# Patient Record
Sex: Male | Born: 1935 | Race: White | Hispanic: No | Smoking: Never smoker
Health system: Southern US, Community
[De-identification: ages and names within clinical notes are randomized; demographics above are authoritative.]

## PROBLEM LIST (undated history)

## (undated) DIAGNOSIS — R519 Headache, unspecified: Secondary | ICD-10-CM

## (undated) DIAGNOSIS — J189 Pneumonia, unspecified organism: Secondary | ICD-10-CM

## (undated) DIAGNOSIS — R51 Headache: Secondary | ICD-10-CM

## (undated) DIAGNOSIS — F329 Major depressive disorder, single episode, unspecified: Secondary | ICD-10-CM

## (undated) DIAGNOSIS — G20 Parkinson's disease: Secondary | ICD-10-CM

## (undated) DIAGNOSIS — G8929 Other chronic pain: Secondary | ICD-10-CM

## (undated) DIAGNOSIS — I4891 Unspecified atrial fibrillation: Secondary | ICD-10-CM

## (undated) DIAGNOSIS — I10 Essential (primary) hypertension: Secondary | ICD-10-CM

## (undated) DIAGNOSIS — I2699 Other pulmonary embolism without acute cor pulmonale: Secondary | ICD-10-CM

## (undated) DIAGNOSIS — I639 Cerebral infarction, unspecified: Secondary | ICD-10-CM

## (undated) DIAGNOSIS — E7800 Pure hypercholesterolemia, unspecified: Secondary | ICD-10-CM

## (undated) DIAGNOSIS — I5043 Acute on chronic combined systolic (congestive) and diastolic (congestive) heart failure: Secondary | ICD-10-CM

## (undated) DIAGNOSIS — M545 Low back pain, unspecified: Secondary | ICD-10-CM

## (undated) DIAGNOSIS — G20A1 Parkinson's disease without dyskinesia, without mention of fluctuations: Secondary | ICD-10-CM

## (undated) DIAGNOSIS — F419 Anxiety disorder, unspecified: Secondary | ICD-10-CM

## (undated) DIAGNOSIS — M1990 Unspecified osteoarthritis, unspecified site: Secondary | ICD-10-CM

## (undated) DIAGNOSIS — D693 Immune thrombocytopenic purpura: Secondary | ICD-10-CM

## (undated) DIAGNOSIS — F32A Depression, unspecified: Secondary | ICD-10-CM

## (undated) DIAGNOSIS — N400 Enlarged prostate without lower urinary tract symptoms: Secondary | ICD-10-CM

## (undated) DIAGNOSIS — Z8739 Personal history of other diseases of the musculoskeletal system and connective tissue: Secondary | ICD-10-CM

## (undated) HISTORY — PX: INGUINAL HERNIA REPAIR: SUR1180

## (undated) HISTORY — PX: CHOLECYSTECTOMY: SHX55

## (undated) HISTORY — DX: Benign prostatic hyperplasia without lower urinary tract symptoms: N40.0

## (undated) HISTORY — PX: HERNIA REPAIR: SHX51

## (undated) HISTORY — PX: TONSILLECTOMY: SUR1361

## (undated) HISTORY — PX: UMBILICAL HERNIA REPAIR: SHX196

## (undated) HISTORY — DX: Other pulmonary embolism without acute cor pulmonale: I26.99

---

## 2013-01-29 DIAGNOSIS — N400 Enlarged prostate without lower urinary tract symptoms: Secondary | ICD-10-CM | POA: Diagnosis present

## 2016-08-26 DIAGNOSIS — J189 Pneumonia, unspecified organism: Secondary | ICD-10-CM

## 2016-08-26 HISTORY — DX: Pneumonia, unspecified organism: J18.9

## 2016-11-30 ENCOUNTER — Emergency Department (HOSPITAL_COMMUNITY): Payer: Medicare Other

## 2016-11-30 ENCOUNTER — Observation Stay (HOSPITAL_COMMUNITY): Payer: Medicare Other

## 2016-11-30 ENCOUNTER — Inpatient Hospital Stay (HOSPITAL_COMMUNITY)
Admission: EM | Admit: 2016-11-30 | Discharge: 2016-12-03 | DRG: 917 | Disposition: A | Discharge status: Skilled Nursing Facility | Payer: Medicare Other | Attending: Internal Medicine | Admitting: Internal Medicine

## 2016-11-30 ENCOUNTER — Encounter (HOSPITAL_COMMUNITY): Payer: Self-pay | Admitting: Emergency Medicine

## 2016-11-30 DIAGNOSIS — R4781 Slurred speech: Secondary | ICD-10-CM | POA: Diagnosis present

## 2016-11-30 DIAGNOSIS — I482 Chronic atrial fibrillation, unspecified: Secondary | ICD-10-CM | POA: Diagnosis present

## 2016-11-30 DIAGNOSIS — R1313 Dysphagia, pharyngeal phase: Secondary | ICD-10-CM | POA: Diagnosis present

## 2016-11-30 DIAGNOSIS — D509 Iron deficiency anemia, unspecified: Secondary | ICD-10-CM | POA: Diagnosis present

## 2016-11-30 DIAGNOSIS — Z7901 Long term (current) use of anticoagulants: Secondary | ICD-10-CM

## 2016-11-30 DIAGNOSIS — T40601A Poisoning by unspecified narcotics, accidental (unintentional), initial encounter: Principal | ICD-10-CM | POA: Diagnosis present

## 2016-11-30 DIAGNOSIS — G20A1 Parkinson's disease without dyskinesia, without mention of fluctuations: Secondary | ICD-10-CM | POA: Diagnosis present

## 2016-11-30 DIAGNOSIS — R262 Difficulty in walking, not elsewhere classified: Secondary | ICD-10-CM

## 2016-11-30 DIAGNOSIS — Z8673 Personal history of transient ischemic attack (TIA), and cerebral infarction without residual deficits: Secondary | ICD-10-CM

## 2016-11-30 DIAGNOSIS — G9340 Encephalopathy, unspecified: Secondary | ICD-10-CM | POA: Diagnosis not present

## 2016-11-30 DIAGNOSIS — Z833 Family history of diabetes mellitus: Secondary | ICD-10-CM

## 2016-11-30 DIAGNOSIS — R296 Repeated falls: Secondary | ICD-10-CM | POA: Diagnosis present

## 2016-11-30 DIAGNOSIS — G92 Toxic encephalopathy: Secondary | ICD-10-CM | POA: Diagnosis present

## 2016-11-30 DIAGNOSIS — I10 Essential (primary) hypertension: Secondary | ICD-10-CM | POA: Diagnosis present

## 2016-11-30 DIAGNOSIS — R269 Unspecified abnormalities of gait and mobility: Secondary | ICD-10-CM | POA: Diagnosis present

## 2016-11-30 DIAGNOSIS — Z86711 Personal history of pulmonary embolism: Secondary | ICD-10-CM | POA: Diagnosis present

## 2016-11-30 DIAGNOSIS — S40029A Contusion of unspecified upper arm, initial encounter: Secondary | ICD-10-CM | POA: Diagnosis present

## 2016-11-30 DIAGNOSIS — D649 Anemia, unspecified: Secondary | ICD-10-CM | POA: Diagnosis present

## 2016-11-30 DIAGNOSIS — Z8249 Family history of ischemic heart disease and other diseases of the circulatory system: Secondary | ICD-10-CM

## 2016-11-30 DIAGNOSIS — S40021A Contusion of right upper arm, initial encounter: Secondary | ICD-10-CM

## 2016-11-30 DIAGNOSIS — D693 Immune thrombocytopenic purpura: Secondary | ICD-10-CM | POA: Diagnosis present

## 2016-11-30 DIAGNOSIS — Z9049 Acquired absence of other specified parts of digestive tract: Secondary | ICD-10-CM

## 2016-11-30 DIAGNOSIS — G20 Parkinson's disease: Secondary | ICD-10-CM | POA: Diagnosis present

## 2016-11-30 DIAGNOSIS — Z888 Allergy status to other drugs, medicaments and biological substances status: Secondary | ICD-10-CM

## 2016-11-30 HISTORY — DX: Parkinson's disease without dyskinesia, without mention of fluctuations: G20.A1

## 2016-11-30 HISTORY — DX: Parkinson's disease: G20

## 2016-11-30 HISTORY — DX: Immune thrombocytopenic purpura: D69.3

## 2016-11-30 HISTORY — DX: Unspecified atrial fibrillation: I48.91

## 2016-11-30 HISTORY — DX: Cerebral infarction, unspecified: I63.9

## 2016-11-30 LAB — COMPREHENSIVE METABOLIC PANEL
ALBUMIN: 3.3 g/dL — AB (ref 3.5–5.0)
ALK PHOS: 73 U/L (ref 38–126)
ALT: <5 U/L — ABNORMAL LOW (ref 17–63)
ANION GAP: 10 (ref 5–15)
AST: 21 U/L (ref 15–41)
BILIRUBIN TOTAL: 1.5 mg/dL — AB (ref 0.3–1.2)
BUN: 16 mg/dL (ref 6–20)
CALCIUM: 9 mg/dL (ref 8.9–10.3)
CO2: 30 mmol/L (ref 22–32)
Chloride: 95 mmol/L — ABNORMAL LOW (ref 101–111)
Creatinine, Ser: 0.83 mg/dL (ref 0.61–1.24)
GFR calc Af Amer: >60 mL/min (ref >60)
GLUCOSE: 97 mg/dL (ref 65–99)
Potassium: 4 mmol/L (ref 3.5–5.1)
Sodium: 135 mmol/L (ref 135–145)
TOTAL PROTEIN: 7.3 g/dL (ref 6.5–8.1)

## 2016-11-30 LAB — CBC WITH DIFFERENTIAL/PLATELET
BASOS PCT: 0 %
Basophils Absolute: 0 10*3/uL (ref 0.0–0.1)
Eosinophils Absolute: 0.1 10*3/uL (ref 0.0–0.7)
Eosinophils Relative: 2 %
HEMATOCRIT: 31.8 % — AB (ref 39.0–52.0)
HEMOGLOBIN: 10.2 g/dL — AB (ref 13.0–17.0)
LYMPHS ABS: 1.4 10*3/uL (ref 0.7–4.0)
LYMPHS PCT: 17 %
MCH: 30.1 pg (ref 26.0–34.0)
MCHC: 32.1 g/dL (ref 30.0–36.0)
MCV: 93.8 fL (ref 78.0–100.0)
Monocytes Absolute: 1.3 10*3/uL — ABNORMAL HIGH (ref 0.1–1.0)
Monocytes Relative: 17 %
NEUTROS ABS: 5 10*3/uL (ref 1.7–7.7)
NEUTROS PCT: 64 %
Platelets: 289 10*3/uL (ref 150–400)
RBC: 3.39 MIL/uL — ABNORMAL LOW (ref 4.22–5.81)
RDW: 15.8 % — ABNORMAL HIGH (ref 11.5–15.5)
WBC: 7.9 10*3/uL (ref 4.0–10.5)

## 2016-11-30 LAB — URINALYSIS, ROUTINE W REFLEX MICROSCOPIC
BILIRUBIN URINE: NEGATIVE
Glucose, UA: NEGATIVE mg/dL
HGB URINE DIPSTICK: NEGATIVE
Ketones, ur: NEGATIVE mg/dL
Leukocytes, UA: NEGATIVE
Nitrite: NEGATIVE
PH: 5 (ref 5.0–8.0)
Protein, ur: NEGATIVE mg/dL
SPECIFIC GRAVITY, URINE: 1.018 (ref 1.005–1.030)

## 2016-11-30 LAB — TROPONIN I: Troponin I: <0.03 ng/mL (ref <0.03)

## 2016-11-30 LAB — PROTIME-INR
INR: 1.79
Prothrombin Time: 21 seconds — ABNORMAL HIGH (ref 11.4–15.2)

## 2016-11-30 LAB — AMMONIA: Ammonia: 17 umol/L (ref 9–35)

## 2016-11-30 MED ORDER — ACETAMINOPHEN 650 MG RE SUPP: 650.0000 mg | Freq: Four times a day (QID) | RECTAL | Status: DC | PRN

## 2016-11-30 MED ORDER — ONDANSETRON HCL 4 MG/2ML IJ SOLN: 4.0000 mg | Freq: Four times a day (QID) | INTRAMUSCULAR | Status: DC | PRN

## 2016-11-30 MED ORDER — SODIUM CHLORIDE 0.9 % IV SOLN
INTRAVENOUS | Status: AC
  Administered 2016-11-30: 23:00:00 via INTRAVENOUS

## 2016-11-30 MED ORDER — HEPARIN BOLUS VIA INFUSION
4000.0000 [IU] | Freq: Once | INTRAVENOUS | Status: AC
  Administered 2016-11-30: 4000 [IU] via INTRAVENOUS
  Filled 2016-11-30: qty 4000

## 2016-11-30 MED ORDER — METOPROLOL TARTRATE 5 MG/5ML IV SOLN
5.0000 mg | Freq: Four times a day (QID) | INTRAVENOUS | Status: DC
  Administered 2016-12-01: 5 mg via INTRAVENOUS
  Filled 2016-11-30: qty 5

## 2016-11-30 MED ORDER — LORAZEPAM 2 MG/ML IJ SOLN
0.5000 mg | Freq: Once | INTRAMUSCULAR | Status: AC
  Administered 2016-11-30: 0.5 mg via INTRAVENOUS
  Filled 2016-11-30: qty 1

## 2016-11-30 MED ORDER — LORAZEPAM 2 MG/ML IJ SOLN
1.0000 mg | Freq: Once | INTRAMUSCULAR | Status: AC
  Administered 2016-11-30: 1 mg via INTRAVENOUS
  Filled 2016-11-30: qty 1

## 2016-11-30 MED ORDER — ACETAMINOPHEN 325 MG PO TABS: 650.0000 mg | ORAL_TABLET | Freq: Four times a day (QID) | ORAL | Status: DC | PRN

## 2016-11-30 MED ORDER — ONDANSETRON HCL 4 MG PO TABS: 4.0000 mg | ORAL_TABLET | Freq: Four times a day (QID) | ORAL | Status: DC | PRN

## 2016-11-30 MED ORDER — HEPARIN (PORCINE) IN NACL 100-0.45 UNIT/ML-% IJ SOLN
1700.0000 [IU]/h | INTRAMUSCULAR | Status: DC
  Administered 2016-11-30: 1400 [IU]/h via INTRAVENOUS
  Filled 2016-11-30 (×2): qty 250

## 2016-11-30 NOTE — H&P (Signed)
History and Physical    Garrett RavelRobert Salva UJW:[id number]:[id number] DOB: [birth year] DOA: 11/30/2016  PCP: Paulino DoorWEISER,MARK, MD  Patient coming from: Home.  Chief Complaint: Confusion.  History obtained from ER physician and care everywhere. Patient is confused and no family at the bedside and unable to reach family.  HPI: Garrett Stewart is a 82 y.o. male with history of Parkinson's disease, atrial fibrillation, chronic ITP, history of pulmonary embolism was brought to the ER after patient was getting increasingly confused and agitated. As per the patient's family there was no change in medications recently. Patient was afebrile in the ER.    ED Course: The patient got agitated and was given Ativan. CT of the head was unremarkable. Chest x-ray UA unremarkable. On my exam patient is sedated and does not follow commands. Patient is being admitted for acute encephalopathy.  Review of Systems: As per HPI, rest all negative.   Past Medical History:  Diagnosis Date  . A-fib (HCC)   . Chronic ITP (idiopathic thrombocytopenic purpura) (HCC)   . Parkinson's disease (HCC)   . Stroke [facility](HCC)     Past Surgical History:  Procedure Laterality Date  . CHOLECYSTECTOMY       reports that he has never smoked. He has never used smokeless tobacco. He reports that he does not drink alcohol or use drugs.  Allergies  Allergen Reactions  . Statins Other (See Comments)    Family History  Problem Relation Age of Onset  . Family history unknown: Yes    Prior to Admission medications   Not on File    Physical Exam: Vitals:   11/30/16 1930 11/30/16 1945 11/30/16 2030 11/30/16 2045  BP: 124/72 112/95  167/88  Pulse: 73  70 86  Resp:   (!) 28 24  SpO2: 100%  95% 100%  Weight:      Height:          Constitutional: Moderately built and nourished. Vitals:   11/30/16 1930 11/30/16 1945 11/30/16 2030 11/30/16 2045  BP: 124/72 112/95  167/88  Pulse: 73  70 86  Resp:   (!) 28 24  SpO2: 100%  95% 100%    Weight:      Height:       Eyes: Anicteric no pallor. ENMT: No discharge from the ears eyes nose and mouth. Neck: No neck rigidity no mass felt. Respiratory: No rhonchi or crepitations. Cardiovascular: S1-S2 heard no murmurs appreciated. Abdomen: Soft nontender bowel sounds present. Musculoskeletal: No edema. No joint effusion. Skin: No rash. Skin appears warm. Neurologic: Patient is encephalopathic. Psychiatric: Patient is encephalopathic.   Labs on Admission: I have personally reviewed following labs and imaging studies  CBC:  Recent Labs Lab 11/30/16 1546  WBC 7.9  NEUTROABS 5.0  HGB 10.2*  HCT 31.8*  MCV 93.8  PLT 289   Basic Metabolic Panel:  Recent Labs Lab 11/30/16 1546  NA 135  K 4.0  CL 95*  CO2 30  GLUCOSE 97  BUN 16  CREATININE 0.83  CALCIUM 9.0   GFR: Estimated Creatinine Clearance: 91.7 mL/min (by C-G formula based on SCr of 0.83 mg/dL). Liver Function Tests:  Recent Labs Lab 11/30/16 1546  AST 21  ALT <5*  ALKPHOS 73  BILITOT 1.5*  PROT 7.3  ALBUMIN 3.3*   No results for input(s): LIPASE, AMYLASE in the last 168 hours.  Recent Labs Lab 11/30/16 2101  AMMONIA 17   Coagulation Profile:  Recent Labs Lab 11/30/16 1546  INR 1.79   Cardiac Enzymes:  Recent Labs Lab 11/30/16 1546  TROPONINI <0.03   BNP (last 3 results) No results for input(s): PROBNP in the last 8760 hours. HbA1C: No results for input(s): HGBA1C in the last 72 hours. CBG: No results for input(s): GLUCAP in the last 168 hours. Lipid Profile: No results for input(s): CHOL, HDL, LDLCALC, TRIG, CHOLHDL, LDLDIRECT in the last 72 hours. Thyroid Function Tests: No results for input(s): TSH, T4TOTAL, FREET4, T3FREE, THYROIDAB in the last 72 hours. Anemia Panel: No results for input(s): VITAMINB12, FOLATE, FERRITIN, TIBC, IRON, RETICCTPCT in the last 72 hours. Urine analysis:    Component Value Date/Time   COLORURINE YELLOW 11/30/2016 1820   APPEARANCEUR  CLEAR 11/30/2016 1820   LABSPEC 1.018 11/30/2016 1820   PHURINE 5.0 11/30/2016 1820   GLUCOSEU NEGATIVE 11/30/2016 1820   HGBUR NEGATIVE 11/30/2016 1820   BILIRUBINUR NEGATIVE 11/30/2016 1820   KETONESUR NEGATIVE 11/30/2016 1820   PROTEINUR NEGATIVE 11/30/2016 1820   NITRITE NEGATIVE 11/30/2016 1820   LEUKOCYTESUR NEGATIVE 11/30/2016 1820   Sepsis Labs: @LABRCNTIP (procalcitonin:4,lacticidven:4) )No results found for this or any previous visit (from the past 240 hour(s)).   Radiological Exams on Admission: Dg Chest 2 View  Result Date: 11/30/2016 CLINICAL DATA:  Altered mental status EXAM: CHEST  2 VIEW COMPARISON:  09/12/2016 FINDINGS: The heart size and mediastinal contours are within normal limits. Both lungs are clear. The visualized skeletal structures are unremarkable. IMPRESSION: No active cardiopulmonary disease. Electronically Signed   By: Elige Ko   On: 11/30/2016 15:31   Ct Head Wo Contrast  Result Date: 11/30/2016 CLINICAL DATA:  Altered mental status today. EXAM: CT HEAD WITHOUT CONTRAST TECHNIQUE: Contiguous axial images were obtained from the base of the skull through the vertex without intravenous contrast. COMPARISON:  Head CT scan 09/10/2016. FINDINGS: Brain: There is no evidence of acute intracranial abnormality including hemorrhage, infarct, mass lesion, mass effect, midline shift or abnormal extra-axial fluid collection. No hydrocephalus or pneumocephalus. Vascular: Mild atherosclerosis is noted. Skull: Intact. Sinuses/Orbits: Mild mucosal thickening is seen in the sphenoid sinuses. Otherwise negative. Other: None. IMPRESSION: No acute abnormality. Electronically Signed   By: Drusilla Kanner M.D.   On: 11/30/2016 16:49   Dg Humerus Right  Result Date: 11/30/2016 CLINICAL DATA:  Bruising of the right upper arm secondary to a fall. EXAM: RIGHT HUMERUS - 2+ VIEW COMPARISON:  None. FINDINGS: There is no fracture or dislocation. Slight arthritis of the [facility] joint. Benign bone  island in the proximal humerus. IMPRESSION: No acute abnormalities. Electronically Signed   By: Francene Boyers M.D.   On: 11/30/2016 15:31    EKG: Independently reviewed. A. fib rate controlled.  Assessment/Plan Active Problems:   Acute encephalopathy   Chronic atrial fibrillation (HCC)   Chronic ITP (idiopathic thrombocytopenia) (HCC)   Hypertension   Parkinson's disease (HCC)   Normocytic normochromic anemia   History of pulmonary embolism    1. Acute encephalopathy - cause not clear. Patient is afebrile. I have ordered MRI of the brain to rule out stroke. Will closely monitor. Urine drug screen is pending. Ammonia levels are normal. We'll get ABG if patient remains persistently encephalopathic to check for CO2. Discussing with my colleague patient also has had history of alcohol abuse per the chart so we'll check Thiamine levels. We'll start empiric thiamine iv once thiamine levels are drawn. 2. History of Parkinson's disease - if patient continues to remain encephalopathic, may need to place NG tube and start patient's home dose of Sinemet to avoid neuroleptic malignant syndrome. 3.  History of atrial fibrillation and pulmonary embolism - patient is on Coumadin. Since patient cannot take oral I have requested pharmacy dose heparin and will place patient on scheduled dose of IV metoprolol since patient is on atenolol as per the care everywhere. 4. History of ITP and iron deficiency anemia - follow CBC.  Need to discuss with family in a.m. once able to be reached.   DVT prophylaxis: Heparin infusion. Code Status: Full code.  Family Communication: Unable to reach family.   Disposition Plan: Home.  Consults called: None.  Admission status: Observation.    Eduard Clos MD [facility] Pager [phone].  If 7PM-7AM, please contact night-coverage [email or web address] Password [facility]  11/30/2016, 10:21 PM

## 2016-11-30 NOTE — ED Notes (Signed)
Patient transported to X-ray 

## 2016-11-30 NOTE — ED Provider Notes (Signed)
[facility]-EMERGENCY DEPT Provider Note   CSN: [id number] Arrival date & time: 11/30/16  1432     History   Chief Complaint Chief Complaint  Patient presents with  . Fall    HPI Rufus Beske is a 82 y.o. male.  Pt presents to the ED with frequent falls and slurred speech.  He has a hx of a.fib on coumadin (chadvasc 6) , ITP, anemia, Parkinson's disease, and CVA.  He fell last week and went to his pcp.  Xray of right shoulder showed no fracture, so he went to the [facility] ED on 3/4.  These are the labs/xrays from that visit:    Labs Reviewed  PT EXPANDED INCLUDES INR - Abnormal; Notable for the following:  Result Value  Prothrombin Time 60.4 (*)  INR 5.21 (*)  All other components within normal limits  Narrative:  RECOMMENDED RANGES FOR PROTIME INR: 2.0-3.0 for most medical and surgical thromboembolic states. 2.5-3.5 for artificial heart valves and recurrent embolism.  BASIC METABOLIC PANEL - Abnormal; Notable for the following:  Sodium 133 (*)  Chloride 95 (*)  Glucose 133 (*)  Creatinine 0.75 (*)  All other components within normal limits  B TYPE NATRIURETIC PEPTIDE - Abnormal; Notable for the following:  BNP 164.0 (*)  All other components within normal limits  UA, MICROSCOPIC IF INDICATED BY DIPSTICK - Abnormal; Notable for the following:  Urine Protein Trace (*)  Urine Ketone Trace (*)  Urine Bilirubin Small (*)  All other components within normal limits  CBC WITH AUTO DIFFERENTIAL PANEL - Abnormal; Notable for the following:  RBC 3.78 (*)  Hemoglobin 11.5 (*)  Hematocrit 35.9 (*)  RDW 16.6 (*)  Neutrophil Absolute 7.1 (*)  Monocyte Absolute 1.1 (*)  All other components within normal limits  CBC AND DIFFERENTIAL  Narrative:  The following orders were created for panel order CBC and Differential. Procedure Abnormality Status  --------- ----------- ------  CBC and Differential([id number]) Abnormal Final result   Please view results for these tests on the  individual orders.   Results for orders placed or performed during the hospital encounter of 11/27/16  XR HUMERUS RIGHT  Narrative  X-RAY RIGHT HUMERUS, 11/27/2016 5:50 PM  INDICATION: fall, right shoulder/arm pain \  COMPARISON: None.  Impression  No acute fracture or malalignment.   XR SHOULDER RT 2 OR MORE VIEWS Axillary, External Rotation, Internal Rotation  Narrative  X-RAY RIGHT SHOULDER (2+ VIEWS), 11/27/2016 5:50 PM  INDICATION: swelling \  COMPARISON: 11/17/2016.  Impression  1. No acute fracture or malalignment.  2. Mild degenerative changes of the shoulder with inferiorly directed osteophytes at the acromioclavicular joint. 3. Similar small sclerotic focus in the humeral neck, likely a benign bone island.  XR CHEST PA AND LATERAL  Narrative  XR CHEST PA AND LATERAL, 11/27/2016 5:48 PM  INDICATION:fall \  COMPARISON: 08/17/2016.  FINDINGS:   Cardiovascular/Mediastinum: Stable cardiomediastinal contours. Lungs/pleura: Clear. No pleural effusion or pneumothorax. Upper abdomen: Upper abdominal surgical clips.  Chest wall/osseous structures: No acute osseous findings.   Impression  There is no evidence of acute cardiac or pulmonary abnormality.  CT Head Wo Contrast  Narrative  CT HEAD WO CONTRAST, 11/27/2016 5:56 PM  INDICATION:fall  COMPARISON: 08/14/2016.  TECHNIQUE: Axial CT images of the brain from skull base to vertex, including portions of the face and sinuses, were obtained without contrast. Supplemental 2D reformatted images were generated and reviewed as needed.  All CT scans at [facility] and [facility] are  performed using dose optimization techniques as appropriate to a performed exam, including but not limited to one or more of the following: automated exposure control, adjustment of the mA and/or kV according to patient size, use of iterative reconstruction technique. In addition, [facility] is participating in the ACR  Dose Registry program which will further assist [place] in optimizing patient radiation exposure.   FINDINGS:  . Calvarium/skull base: Mild occipital scalp swelling. No evidence of fracture or destructive lesion. Mastoids and middle ears grossly clear. . Paranasal sinuses: Scattered inflammatory paranasal sinus mucosal thickening with fluid layering in the sphenoid sinuses. . Brain: Mild cerebral volume loss and patchy white matter hypoattenuation, compatible chronic microvascular ischemic changes. No evidence of acute ischemia. No mass effect, mass lesion, acute hemorrhage, or hydrocephalus. Intercranial atherosclerosis.   Impression  1. No CT evidence of acute intracranial abnormality. Although no evidence of acute infarction, mass, or hemorrhage is seen, CT is relatively insensitive for the detection of hypoxia/ischemia within the first 24-48 hours, and an MRI scan may be indicated. 2. Similar cerebral volume loss and chronic microvascular ischemic changes. 3. Inflammatory paranasal sinus disease with fluid layering in the sphenoid sinuses, which can be seen in the setting of acute sinusitis.   EKG: Rate: 75, Rhythm: atrial fibrillation, Intervals: PR-, QRS 104, QTc 439, ST/T Wave abnormalities: no, Conduction Disutrbances: none Narrative Interpretation: A. fib with no ST changes. Normal axis. Old EKG Reviewed: no large change from prior Indication for study: falls   Pt is brought to the ED today due to mental status changes.  The pt's friend said he was having hallucinations today.  He has also been more unsteady on his feet.  This is not his usual.  Of note, they have been holding his coumadin since the 4th because his INR was elevated.      Past Medical History:  Diagnosis Date  . A-fib (HCC)   . Chronic ITP (idiopathic thrombocytopenic purpura) (HCC)   . Parkinson's disease (HCC)   . Stroke [facility])   Past Medical History:  Diagnosis Date  . Anemia  . Atrial fibrillation (HCC)  .  Cholelithiases  . Chronic ITP (idiopathic thrombocytopenia) (HCC)  . CVA (cerebral infarction)  . ETOH abuse  . GERD (gastroesophageal reflux disease)  . GI bleed  . Hypertension  . Hypertension with goal to be determined  . Parkinson's disease (HCC)  . Pulmonary embolism (HCC) 10/03/13  . Stroke (HCC)  . Stroke (HCC) 2014  . Thrombocytopenia (HCC)  . Transfusion history   There are no active problems to display for this patient.   Past Surgical History:  Procedure Laterality Date  . CHOLECYSTECTOMY       Past Surgical History:  Procedure Laterality Date  . ABLATION  . CHOLECYSTECTOMY  . CIRCUMCISION  . FINGER SURGERY  cut finger off with paper shearer  . HERNIA REPAIR  . LAPAROSCOPIC CHOLECYSTECTOMY W/ CHOLANGIOGRAPHY N/A 03/20/2014  Procedure: CHOLECYSTECTOMY LAPAROSCOPIC W/ CHOLANGIOGRAM, repair of umbilical hernia; Surgeon: Corrie Mckusick, MD; Location[facility] MAIN OR; Service: General; Laterality: N/A;       Home Medications    Prior to Admission medications   Not on File    Family History No family history on file.   Family History  Problem Relation Age of Onset  . Adopted: Yes  . Diabetes Daughter  . Hypertension Son  Social History Social History  Substance Use Topics  . Smoking status: Never Smoker  . Smokeless tobacco: Never Used  . Alcohol use No  Social History  Substance Use Topics  . Smoking status: Never Smoker  . Smokeless tobacco: Current User  Types: Chew  Comment: occasional  . Alcohol use Yes  Comment: last drink 4 weeks ago, recent rehab [place]   Allergies   Statins   Review of Systems Review of Systems  Musculoskeletal:       Right arm/shoulder pain  All other systems reviewed and are negative.    Physical Exam Updated Vital Signs BP 150/73   Pulse 95   Resp 18   Ht 6' (1.829 m)   Wt 238 lb (108 kg)   SpO2 97%   BMI 32.28 kg/m   Physical Exam  Constitutional: He is oriented to person, place,  and time. He appears well-developed and well-nourished.  HENT:  Head: Normocephalic and atraumatic.  Right Ear: External ear normal.  Left Ear: External ear normal.  Nose: Nose normal.  Mouth/Throat: Oropharynx is clear and moist.  Eyes: Conjunctivae and EOM are normal. Pupils are equal, round, and reactive to light.  Neck: Normal range of motion. Neck supple.  Cardiovascular: Normal rate, normal heart sounds and intact distal pulses.  An irregularly irregular rhythm present.  Pulmonary/Chest: Effort normal and breath sounds normal.  Abdominal: Soft. Bowel sounds are normal.  Musculoskeletal:  Right upper arm swelling/bruising  Neurological: He is alert and oriented to person, place, and time.  Skin: Skin is warm.  Bruises/abrasions all over body  Psychiatric: He has a normal mood and affect. His behavior is normal. Judgment and thought content normal.  Nursing note and vitals reviewed.    ED Treatments / Results  Labs (all labs ordered are listed, but only abnormal results are displayed) Labs Reviewed  CBC WITH DIFFERENTIAL/PLATELET - Abnormal; Notable for the following:       Result Value   RBC 3.39 (*)    Hemoglobin 10.2 (*)    HCT 31.8 (*)    RDW 15.8 (*)    Monocytes Absolute 1.3 (*)    All other components within normal limits  COMPREHENSIVE METABOLIC PANEL - Abnormal; Notable for the following:    Chloride 95 (*)    Albumin 3.3 (*)    ALT <5 (*)    Total Bilirubin 1.5 (*)    All other components within normal limits  PROTIME-INR - Abnormal; Notable for the following:    Prothrombin Time 21.0 (*)    All other components within normal limits  URINALYSIS, ROUTINE W REFLEX MICROSCOPIC  TROPONIN I    EKG  EKG Interpretation  Date/Time:  Wednesday November 30 2016 16:01:59 EST Ventricular Rate:  81 PR Interval:    QRS Duration: 111 QT Interval:  388 QTC Calculation: 451 R Axis:   79 Text Interpretation:  Atrial fibrillation no old to compare, but hx a.fib.  Confirmed by [facility] MD, Danel Requena [phone]) on 11/30/2016 4:37:16 PM       Radiology Dg Chest 2 View  Result Date: 11/30/2016 CLINICAL DATA:  Altered mental status EXAM: CHEST  2 VIEW COMPARISON:  09/12/2016 FINDINGS: The heart size and mediastinal contours are within normal limits. Both lungs are clear. The visualized skeletal structures are unremarkable. IMPRESSION: No active cardiopulmonary disease. Electronically Signed   By: Elige Ko   On: 11/30/2016 15:31   Ct Head Wo Contrast  Result Date: 11/30/2016 CLINICAL DATA:  Altered mental status today. EXAM: CT HEAD WITHOUT CONTRAST TECHNIQUE: Contiguous axial images were obtained from the base of the skull through the vertex without intravenous contrast. COMPARISON:  Head CT scan 09/10/2016. FINDINGS: Brain: There is no evidence of acute intracranial abnormality including hemorrhage, infarct, mass lesion, mass effect, midline shift or abnormal extra-axial fluid collection. No hydrocephalus or pneumocephalus. Vascular: Mild atherosclerosis is noted. Skull: Intact. Sinuses/Orbits: Mild mucosal thickening is seen in the sphenoid sinuses. Otherwise negative. Other: None. IMPRESSION: No acute abnormality. Electronically Signed   By: Drusilla Kannerhomas  Dalessio M.D.   On: 11/30/2016 16:49   Dg Humerus Right  Result Date: 11/30/2016 CLINICAL DATA:  Bruising of the right upper arm secondary to a fall. EXAM: RIGHT HUMERUS - 2+ VIEW COMPARISON:  None. FINDINGS: There is no fracture or dislocation. Slight arthritis of the [facility] joint. Benign bone island in the proximal humerus. IMPRESSION: No acute abnormalities. Electronically Signed   By: Francene BoyersJames  Maxwell M.D.   On: 11/30/2016 15:31    Procedures Procedures (including critical care time)  Medications Ordered in ED Medications - No data to display   Initial Impression / Assessment and Plan / ED Course  I have reviewed the triage vital signs and the nursing notes.  Pertinent labs & imaging results that were available  during my care of the patient were reviewed by me and considered in my medical decision making (see chart for details).    Since pt has been here, he has developed hallucinations.  He thinks he's in a dirty ware house tied up with string.  He is getting his significant other confused with other wives he's had.  He is no longer oriented.  The pt is also unable to walk.   Pt d/w Dr. Toniann FailKakrakandy ([facility]) for admission.  Final Clinical Impressions(s) / ED Diagnoses   Final diagnoses:  Encephalopathy  Ambulatory dysfunction  Arm contusion, right, initial encounter  Anticoagulated on Coumadin    New Prescriptions New Prescriptions   No medications on file     Jacalyn LefevreJulie Chalice Philbert, MD 11/30/16 2047

## 2016-11-30 NOTE — ED Notes (Signed)
Pt given sandwich and orange juice

## 2016-11-30 NOTE — ED Triage Notes (Signed)
Pt here from a friends house with c/o frequent falls and slurred speech , unknown if slurred speech is new or old , , pt is on blood thinners

## 2016-11-30 NOTE — ED Notes (Signed)
Pt agitated, ordered ativan but pt refusing and yelling at friend and RN

## 2016-11-30 NOTE — ED Notes (Signed)
Patient transported to MRI 

## 2016-11-30 NOTE — ED Notes (Signed)
Patient transported to CT 

## 2016-11-30 NOTE — Progress Notes (Signed)
ANTICOAGULATION CONSULT NOTE - Initial Consult  Pharmacy Consult for heparin  Indication: AFib  Allergies  Allergen Reactions  . Statins Other (See Comments)    Patient Measurements: Height: 6' (182.9 cm) Weight: 238 lb (108 kg) IBW/kg (Calculated) : 77.6 Heparin Dosing Weight: 100 kg  Vital Signs: BP: 167/88 (03/07 2045) Pulse Rate: 86 (03/07 2045)  Labs:  Recent Labs  11/30/16 1546  HGB 10.2*  HCT 31.8*  PLT 289  LABPROT 21.0*  INR 1.79  CREATININE 0.83  TROPONINI <0.03    Estimated Creatinine Clearance: 91.7 mL/min (by C-G formula based on SCr of 0.83 mg/dL).   Medical History: Past Medical History:  Diagnosis Date  . A-fib (HCC)   . Chronic ITP (idiopathic thrombocytopenic purpura) (HCC)   . Parkinson's disease (HCC)   . Stroke [facility](HCC)       Assessment: 82 yo male due to frequent falls and slurred speech, pt is on warfarin PTA for AFib and hx of CVA. Recently admitted at outside hospital due to a fall, though he has a history of frequent falls. Current INR is 1.79. CBC stable and wnl.   Goal of Therapy:  Heparin level 0.3-0.7 units/ml Monitor platelets by anticoagulation protocol: Yes     Plan:  -Heparin 4000 units x1 then 1400 units//hr -Daily HL, CBC -F/u plan for oral anticoagulation   Narayan Scull, Darl HouseholderAlison M 11/30/2016,10:26 PM

## 2016-12-01 DIAGNOSIS — T40601A Poisoning by unspecified narcotics, accidental (unintentional), initial encounter: Secondary | ICD-10-CM | POA: Diagnosis present

## 2016-12-01 DIAGNOSIS — Z86711 Personal history of pulmonary embolism: Secondary | ICD-10-CM

## 2016-12-01 DIAGNOSIS — Z833 Family history of diabetes mellitus: Secondary | ICD-10-CM | POA: Diagnosis not present

## 2016-12-01 DIAGNOSIS — I482 Chronic atrial fibrillation: Secondary | ICD-10-CM | POA: Diagnosis present

## 2016-12-01 DIAGNOSIS — G20 Parkinson's disease: Secondary | ICD-10-CM

## 2016-12-01 DIAGNOSIS — I10 Essential (primary) hypertension: Secondary | ICD-10-CM | POA: Diagnosis present

## 2016-12-01 DIAGNOSIS — Z7901 Long term (current) use of anticoagulants: Secondary | ICD-10-CM | POA: Diagnosis not present

## 2016-12-01 DIAGNOSIS — R4781 Slurred speech: Secondary | ICD-10-CM | POA: Diagnosis present

## 2016-12-01 DIAGNOSIS — I159 Secondary hypertension, unspecified: Secondary | ICD-10-CM | POA: Diagnosis not present

## 2016-12-01 DIAGNOSIS — Z888 Allergy status to other drugs, medicaments and biological substances status: Secondary | ICD-10-CM | POA: Diagnosis not present

## 2016-12-01 DIAGNOSIS — D693 Immune thrombocytopenic purpura: Secondary | ICD-10-CM

## 2016-12-01 DIAGNOSIS — G9340 Encephalopathy, unspecified: Secondary | ICD-10-CM

## 2016-12-01 DIAGNOSIS — D509 Iron deficiency anemia, unspecified: Secondary | ICD-10-CM | POA: Diagnosis present

## 2016-12-01 DIAGNOSIS — R296 Repeated falls: Secondary | ICD-10-CM | POA: Diagnosis present

## 2016-12-01 DIAGNOSIS — S40029A Contusion of unspecified upper arm, initial encounter: Secondary | ICD-10-CM | POA: Diagnosis present

## 2016-12-01 DIAGNOSIS — R269 Unspecified abnormalities of gait and mobility: Secondary | ICD-10-CM | POA: Diagnosis present

## 2016-12-01 DIAGNOSIS — Z8673 Personal history of transient ischemic attack (TIA), and cerebral infarction without residual deficits: Secondary | ICD-10-CM | POA: Diagnosis not present

## 2016-12-01 DIAGNOSIS — Z8249 Family history of ischemic heart disease and other diseases of the circulatory system: Secondary | ICD-10-CM | POA: Diagnosis not present

## 2016-12-01 DIAGNOSIS — Z9049 Acquired absence of other specified parts of digestive tract: Secondary | ICD-10-CM | POA: Diagnosis not present

## 2016-12-01 DIAGNOSIS — G92 Toxic encephalopathy: Secondary | ICD-10-CM | POA: Diagnosis present

## 2016-12-01 DIAGNOSIS — D649 Anemia, unspecified: Secondary | ICD-10-CM | POA: Diagnosis present

## 2016-12-01 DIAGNOSIS — R1313 Dysphagia, pharyngeal phase: Secondary | ICD-10-CM | POA: Diagnosis present

## 2016-12-01 LAB — CBC
HCT: 30.9 % — ABNORMAL LOW (ref 39.0–52.0)
HEMOGLOBIN: 9.5 g/dL — AB (ref 13.0–17.0)
MCH: 28.8 pg (ref 26.0–34.0)
MCHC: 30.7 g/dL (ref 30.0–36.0)
MCV: 93.6 fL (ref 78.0–100.0)
Platelets: 294 10*3/uL (ref 150–400)
RBC: 3.3 MIL/uL — AB (ref 4.22–5.81)
RDW: 15.3 % (ref 11.5–15.5)
WBC: 7.7 10*3/uL (ref 4.0–10.5)

## 2016-12-01 LAB — HEPATIC FUNCTION PANEL
ALBUMIN: 3.1 g/dL — AB (ref 3.5–5.0)
ALT: 11 U/L — AB (ref 17–63)
AST: 19 U/L (ref 15–41)
Alkaline Phosphatase: 71 U/L (ref 38–126)
BILIRUBIN DIRECT: 0.4 mg/dL (ref 0.1–0.5)
Indirect Bilirubin: 1.4 mg/dL — ABNORMAL HIGH (ref 0.3–0.9)
Total Bilirubin: 1.8 mg/dL — ABNORMAL HIGH (ref 0.3–1.2)
Total Protein: 7.2 g/dL (ref 6.5–8.1)

## 2016-12-01 LAB — GLUCOSE, CAPILLARY
GLUCOSE-CAPILLARY: 122 mg/dL — AB (ref 65–99)
Glucose-Capillary: 109 mg/dL — ABNORMAL HIGH (ref 65–99)
Glucose-Capillary: 96 mg/dL (ref 65–99)
Glucose-Capillary: 99 mg/dL (ref 65–99)

## 2016-12-01 LAB — TSH: TSH: 1.807 u[IU]/mL (ref 0.350–4.500)

## 2016-12-01 LAB — BASIC METABOLIC PANEL
ANION GAP: 9 (ref 5–15)
BUN: 14 mg/dL (ref 6–20)
CALCIUM: 8.6 mg/dL — AB (ref 8.9–10.3)
CHLORIDE: 97 mmol/L — AB (ref 101–111)
CO2: 28 mmol/L (ref 22–32)
Creatinine, Ser: 0.76 mg/dL (ref 0.61–1.24)
GFR calc non Af Amer: >60 mL/min (ref >60)
Glucose, Bld: 98 mg/dL (ref 65–99)
Potassium: 3.9 mmol/L (ref 3.5–5.1)
Sodium: 134 mmol/L — ABNORMAL LOW (ref 135–145)

## 2016-12-01 LAB — RAPID URINE DRUG SCREEN, HOSP PERFORMED
AMPHETAMINES: NOT DETECTED
BARBITURATES: NOT DETECTED
BENZODIAZEPINES: NOT DETECTED
Cocaine: NOT DETECTED
Opiates: POSITIVE — AB
TETRAHYDROCANNABINOL: NOT DETECTED

## 2016-12-01 LAB — HEPARIN LEVEL (UNFRACTIONATED)
HEPARIN UNFRACTIONATED: 0.1 [IU]/mL — AB (ref 0.30–0.70)
Heparin Unfractionated: <0.1 IU/mL — ABNORMAL LOW (ref 0.30–0.70)

## 2016-12-01 LAB — CBG MONITORING, ED
Glucose-Capillary: 90 mg/dL (ref 65–99)
Glucose-Capillary: 92 mg/dL (ref 65–99)

## 2016-12-01 LAB — ETHANOL

## 2016-12-01 MED ORDER — ZOLPIDEM TARTRATE 5 MG PO TABS
2.5000 mg | ORAL_TABLET | Freq: Once | ORAL | Status: AC
  Administered 2016-12-02: 2.5 mg via ORAL
  Filled 2016-12-01: qty 1

## 2016-12-01 MED ORDER — WARFARIN - PHARMACIST DOSING INPATIENT: Freq: Every day | Status: DC

## 2016-12-01 MED ORDER — HEPARIN (PORCINE) IN NACL 100-0.45 UNIT/ML-% IJ SOLN
1700.0000 [IU]/h | INTRAMUSCULAR | Status: DC
  Administered 2016-12-02: 1700 [IU]/h via INTRAVENOUS
  Filled 2016-12-01: qty 250

## 2016-12-01 MED ORDER — WARFARIN SODIUM 5 MG PO TABS
5.0000 mg | ORAL_TABLET | Freq: Once | ORAL | Status: AC
  Administered 2016-12-01: 5 mg via ORAL
  Filled 2016-12-01: qty 1

## 2016-12-01 MED ORDER — B COMPLEX-C PO TABS
1.0000 | ORAL_TABLET | Freq: Every day | ORAL | Status: DC
  Administered 2016-12-01 – 2016-12-03 (×3): 1 via ORAL
  Filled 2016-12-01 (×3): qty 1

## 2016-12-01 MED ORDER — FINASTERIDE 5 MG PO TABS
5.0000 mg | ORAL_TABLET | Freq: Every day | ORAL | Status: DC
  Administered 2016-12-01 – 2016-12-03 (×3): 5 mg via ORAL
  Filled 2016-12-01 (×3): qty 1

## 2016-12-01 MED ORDER — DARIFENACIN HYDROBROMIDE ER 7.5 MG PO TB24
7.5000 mg | ORAL_TABLET | Freq: Every day | ORAL | Status: DC
  Administered 2016-12-01 – 2016-12-03 (×3): 7.5 mg via ORAL
  Filled 2016-12-01 (×3): qty 1

## 2016-12-01 MED ORDER — ATENOLOL 25 MG PO TABS
100.0000 mg | ORAL_TABLET | Freq: Every day | ORAL | Status: DC
  Administered 2016-12-01 – 2016-12-03 (×3): 100 mg via ORAL
  Filled 2016-12-01 (×3): qty 4

## 2016-12-01 MED ORDER — CARBIDOPA-LEVODOPA 25-100 MG PO TABS
2.0000 | ORAL_TABLET | Freq: Two times a day (BID) | ORAL | Status: DC
  Administered 2016-12-01 – 2016-12-03 (×5): 2 via ORAL
  Filled 2016-12-01 (×5): qty 2

## 2016-12-01 MED ORDER — TAMSULOSIN HCL 0.4 MG PO CAPS
0.4000 mg | ORAL_CAPSULE | Freq: Every day | ORAL | Status: DC
  Administered 2016-12-01 – 2016-12-03 (×3): 0.4 mg via ORAL
  Filled 2016-12-01 (×3): qty 1

## 2016-12-01 MED ORDER — CARBIDOPA-LEVODOPA 25-100 MG PO TABS
1.0000 | ORAL_TABLET | ORAL | Status: DC
  Administered 2016-12-01 – 2016-12-02 (×3): 1 via ORAL
  Filled 2016-12-01 (×4): qty 1

## 2016-12-01 MED ORDER — DULOXETINE HCL 60 MG PO CPEP
60.0000 mg | ORAL_CAPSULE | Freq: Every day | ORAL | Status: DC
  Administered 2016-12-01 – 2016-12-03 (×3): 60 mg via ORAL
  Filled 2016-12-01 (×3): qty 1

## 2016-12-01 MED ORDER — POLYETHYLENE GLYCOL 3350 17 G PO PACK: 17.0000 g | PACK | Freq: Every day | ORAL | Status: DC | PRN

## 2016-12-01 NOTE — Progress Notes (Signed)
PROGRESS NOTE    Garrett RavelRobert Stewart  ZOX:[id number] DOB: Jan 14, 1935 DOA: 11/30/2016 PCP: Paulino DoorWEISER,MARK, MD   Chief Complaint  Patient presents with  . Fall     Brief Narrative:  HPI on 11/30/2016 by Dr. Midge MiniumArshad Kakrakandy Garrett Stewart is a 82 y.o. male with history of Parkinson's disease, atrial fibrillation, chronic ITP, history of pulmonary embolism was brought to the ER after patient was getting increasingly confused and agitated. As per the patient's family there was no change in medications recently. Patient was afebrile in the ER.   Assessment & Plan   Acute encephalopathy -Unknown etiology -Currently afebrile, no leukocytosis -CT head/MRI brain unremarkable for acute abnormalities -UA and CXR unremarkable for infection -Drug screen +opiates, Alcohol level <5 -Patient was given ativan overnight for MRI and agitation -?overdose of narcotics (home meds show tramadol, narcotics, robaxin) -Continue tele monitoring  -Continue neuro checks  Parkinson's diseaase -Continue sinemet when able   Atrial fibrillation/history of PE -continue heparin for now  -CHADSVASC 6 -continue IV metoprolol  History ITP/ Iron deficiency anemia -Continue to monitor CBC  DVT Prophylaxis  heparin  Code Status: Full  Family Communication: none at bedside  Disposition Plan: currently in observation  Consultants None  Procedures  none  Antibiotics   Anti-infectives    None      Subjective:   Garrett Stewart seen and examined today.  Currently unarousable, responds to painful stimuli.   Objective:   Vitals:   12/01/16 0939 12/01/16 1000 12/01/16 1100 12/01/16 1400  BP: (!) 153/90  92/66 132/68  Pulse: 80  80 86  Resp: 20  20 20   Temp: 97.6 F (36.4 C)   98.1 F (36.7 C)  TempSrc: Oral   Oral  SpO2: 100%  97% 100%  Weight:  108.4 kg (238 lb 14.4 oz)    Height:       No intake or output data in the 24 hours ending 12/01/16 1450 Filed Weights   11/30/16 1554 12/01/16 1000    Weight: 108 kg (238 lb) 108.4 kg (238 lb 14.4 oz)    Exam  General: Well developed, well nourished, NAD  HEENT: NCAT,  mucous membranes moist.   Cardiovascular: S1 S2 auscultated, no rubs, murmurs or gallops. irregular  Respiratory: Clear to auscultation bilaterally with equal chest rise  Abdomen: Soft, nontender, nondistended, + bowel sounds  Extremities: warm dry without cyanosis clubbing or edema. Bruising and swelling of RUE  Neuro: unable to assess, withdraws to painful/tactile stimuli  Psych: unable to assess   Data Reviewed: I have personally reviewed following labs and imaging studies  CBC:  Recent Labs Lab 11/30/16 1546 12/01/16 0358  WBC 7.9 7.7  NEUTROABS 5.0  --   HGB 10.2* 9.5*  HCT 31.8* 30.9*  MCV 93.8 93.6  PLT 289 294   Basic Metabolic Panel:  Recent Labs Lab 11/30/16 1546 12/01/16 0358  NA 135 134*  K 4.0 3.9  CL 95* 97*  CO2 30 28  GLUCOSE 97 98  BUN 16 14  CREATININE 0.83 0.76  CALCIUM 9.0 8.6*   GFR: Estimated Creatinine Clearance: 95.2 mL/min (by C-G formula based on SCr of 0.76 mg/dL). Liver Function Tests:  Recent Labs Lab 11/30/16 1546 12/01/16 0358  AST 21 19  ALT <5* 11*  ALKPHOS 73 71  BILITOT 1.5* 1.8*  PROT 7.3 7.2  ALBUMIN 3.3* 3.1*   No results for input(s): LIPASE, AMYLASE in the last 168 hours.  Recent Labs Lab 11/30/16 2101  AMMONIA 17  Coagulation Profile:  Recent Labs Lab 11/30/16 1546  INR 1.79   Cardiac Enzymes:  Recent Labs Lab 11/30/16 1546  TROPONINI <0.03   BNP (last 3 results) No results for input(s): PROBNP in the last 8760 hours. HbA1C: No results for input(s): HGBA1C in the last 72 hours. CBG:  Recent Labs Lab 12/01/16 0259 12/01/16 0431 12/01/16 1240  GLUCAP 90 92 96   Lipid Profile: No results for input(s): CHOL, HDL, LDLCALC, TRIG, CHOLHDL, LDLDIRECT in the last 72 hours. Thyroid Function Tests:  Recent Labs  12/01/16 0056  TSH 1.807   Anemia Panel: No  results for input(s): VITAMINB12, FOLATE, FERRITIN, TIBC, IRON, RETICCTPCT in the last 72 hours. Urine analysis:    Component Value Date/Time   COLORURINE YELLOW 11/30/2016 1820   APPEARANCEUR CLEAR 11/30/2016 1820   LABSPEC 1.018 11/30/2016 1820   PHURINE 5.0 11/30/2016 1820   GLUCOSEU NEGATIVE 11/30/2016 1820   HGBUR NEGATIVE 11/30/2016 1820   BILIRUBINUR NEGATIVE 11/30/2016 1820   KETONESUR NEGATIVE 11/30/2016 1820   PROTEINUR NEGATIVE 11/30/2016 1820   NITRITE NEGATIVE 11/30/2016 1820   LEUKOCYTESUR NEGATIVE 11/30/2016 1820   Sepsis Labs: @LABRCNTIP (procalcitonin:4,lacticidven:4)  )No results found for this or any previous visit (from the past 240 hour(s)).    Radiology Studies: Dg Chest 2 View  Result Date: 11/30/2016 CLINICAL DATA:  Altered mental status EXAM: CHEST  2 VIEW COMPARISON:  09/12/2016 FINDINGS: The heart size and mediastinal contours are within normal limits. Both lungs are clear. The visualized skeletal structures are unremarkable. IMPRESSION: No active cardiopulmonary disease. Electronically Signed   By: Elige Ko   On: 11/30/2016 15:31   Ct Head Wo Contrast  Result Date: 11/30/2016 CLINICAL DATA:  Altered mental status today. EXAM: CT HEAD WITHOUT CONTRAST TECHNIQUE: Contiguous axial images were obtained from the base of the skull through the vertex without intravenous contrast. COMPARISON:  Head CT scan 09/10/2016. FINDINGS: Brain: There is no evidence of acute intracranial abnormality including hemorrhage, infarct, mass lesion, mass effect, midline shift or abnormal extra-axial fluid collection. No hydrocephalus or pneumocephalus. Vascular: Mild atherosclerosis is noted. Skull: Intact. Sinuses/Orbits: Mild mucosal thickening is seen in the sphenoid sinuses. Otherwise negative. Other: None. IMPRESSION: No acute abnormality. Electronically Signed   By: Drusilla Kanner M.D.   On: 11/30/2016 16:49   Mr Brain Wo Contrast  Result Date: 11/30/2016 CLINICAL DATA:   Acute encephalopathy. Frequent falls and slurred speech. On Coumadin. History of Parkinson's disease, atrial fibrillation and stroke. EXAM: MRI HEAD WITHOUT CONTRAST TECHNIQUE: Multiplanar, multiecho pulse sequences of the brain and surrounding structures were obtained without intravenous contrast. Due to patient motion, sagittal T1, coronal T2 not obtained. COMPARISON:  CT HEAD November 30, 2016 at 1640 hours FINDINGS: Moderately motion degraded examination. BRAIN: No reduced diffusion to suggest acute ischemia. No susceptibility artifact to lobar hematoma though with small hemorrhages would not be appreciated due to motion. Minimal white matter changes most compatible with chronic small vessel ischemic disease. The ventricles and sulci are normal for patient's age. No suspicious parenchymal signal, masses or mass effect. No abnormal extra-axial fluid collections. VASCULAR: Normal major intracranial vascular flow voids present at skull base. SKULL AND UPPER CERVICAL SPINE: No abnormal sellar expansion. No suspicious calvarial bone marrow signal. Craniocervical junction maintained. SINUSES/ORBITS: Small sphenoid sinus air-fluid levels with mild paranasal sinus mucosal thickening. Mastoid air cells are well aerated. The included ocular globes and orbital contents are non-suspicious. OTHER: None. IMPRESSION: Normal noncontrast MRI of the head for age though, limited by moderate motion and  incomplete examination. Electronically Signed   By: Awilda Metro M.D.   On: 11/30/2016 22:54   Dg Humerus Right  Result Date: 11/30/2016 CLINICAL DATA:  Bruising of the right upper arm secondary to a fall. EXAM: RIGHT HUMERUS - 2+ VIEW COMPARISON:  None. FINDINGS: There is no fracture or dislocation. Slight arthritis of the [facility] joint. Benign bone island in the proximal humerus. IMPRESSION: No acute abnormalities. Electronically Signed   By: Francene Boyers M.D.   On: 11/30/2016 15:31     Scheduled Meds: . metoprolol  5 mg  Intravenous Q6H   Continuous Infusions: . sodium chloride 125 mL/hr at 11/30/16 2301  . heparin 1,700 Units/hr (12/01/16 1342)     LOS: 0 days   Time Spent in minutes   30 minutes  Jaslyne Beeck D.O. on 12/01/2016 at 2:50 PM  Between 7am to 7pm - Pager - [phone]  After 7pm go to [email or web address] - password [email or web address]  And look for the night coverage person covering for me after hours  [facility] Office  [phone]

## 2016-12-01 NOTE — Progress Notes (Signed)
Pt asked RN why he was here. RN stated: "falls and AMS". Pt stated: "Must have been all the pills I took."  RN inquired further and patient stated he took: 2 robaxin, 4 norco and 2 oxy within 30 min. period.  Dr. Catha GosselinMikhail notified. Pt currently alert and oriented, VSS. Will continue to monitor. Asher Muir- [place]ila Bridge,RN

## 2016-12-01 NOTE — Progress Notes (Signed)
ANTICOAGULATION CONSULT NOTE - Follow Up Consult  Pharmacy Consult for heparin Indication: atrial fibrillation   Assessment/Plan:  Heparin level undetectable this pm but MAR shows that heparin gtt was d/c'd ~1815 by day RN; spoke w/ current RN who doesn't know why heparin was d/c'd other than for lost IV line >> spoke w/ Zachary GeorgeKKirby w/ [facility] who agrees that heparin should continue.  RN calling IVT to get another IV line and will resume heparin.  Vernard GamblesVeronda Alexzander Dolinger, PharmD, BCPS  12/01/2016,11:23 PM

## 2016-12-01 NOTE — Progress Notes (Addendum)
Addendum:  Pharmacy asked to restart warfarin. Will restart the patient's home warfarin 5mg  dose tonight.  Ruben Imony Jowan Skillin, PharmD Clinical Pharmacist Pager: [phone] 12/01/2016 5:39 PM  ___________________________________________________________  ANTICOAGULATION CONSULT NOTE - Initial Consult  Pharmacy Consult for heparin  Indication: AFib  Allergies  Allergen Reactions  . Statins Other (See Comments)    Patient Measurements: Height: 6' (182.9 cm) Weight: 238 lb 14.4 oz (108.4 kg) IBW/kg (Calculated) : 77.6 Heparin Dosing Weight: 100 kg  Vital Signs: Temp: 97.6 F (36.4 C) (03/08 0939) Temp Source: Oral (03/08 0939) BP: 92/66 (03/08 1100) Pulse Rate: 80 (03/08 1100)  Labs:  Recent Labs  11/30/16 1546 12/01/16 0358 12/01/16 1059  HGB 10.2* 9.5*  --   HCT 31.8* 30.9*  --   PLT 289 294  --   LABPROT 21.0*  --   --   INR 1.79  --   --   HEPARINUNFRC  --   --  0.10*  CREATININE 0.83 0.76  --   TROPONINI <0.03  --   --     Estimated Creatinine Clearance: 95.2 mL/min (by C-G formula based on SCr of 0.76 mg/dL).   Medical History: Past Medical History:  Diagnosis Date  . A-fib (HCC)   . Chronic ITP (idiopathic thrombocytopenic purpura) (HCC)   . Parkinson's disease (HCC)   . Stroke [facility](HCC)       Assessment: 82 yo male due to frequent falls and slurred speech, pt is on warfarin PTA for AFib and hx of CVA. Recently admitted at outside hospital due to a fall, though he has a history of frequent falls.  INR drawn last night is 1.79, not rechecked this am but will order for daily for the next few days. CBC stable and wnl.  Patient presented with AMS and unable to take po's so warfarin is being held and heparin started with low INR, he also has history of PE. Initial heparin level undetectable, will adjust.  Goal of Therapy:  Heparin level 0.3-0.7 units/ml Monitor platelets by anticoagulation protocol: Yes     Plan:  -Increase heparin to 1700  units//hr -Daily HL, CBC, add daily INR -F/u plan for oral anticoagulation  Sheppard CoilFrank Wilson PharmD., BCPS Clinical Pharmacist Pager [phone] 12/01/2016 1:36 PM

## 2016-12-02 ENCOUNTER — Inpatient Hospital Stay (HOSPITAL_COMMUNITY): Payer: Medicare Other

## 2016-12-02 DIAGNOSIS — I159 Secondary hypertension, unspecified: Secondary | ICD-10-CM

## 2016-12-02 DIAGNOSIS — D649 Anemia, unspecified: Secondary | ICD-10-CM

## 2016-12-02 LAB — GLUCOSE, CAPILLARY
GLUCOSE-CAPILLARY: 107 mg/dL — AB (ref 65–99)
GLUCOSE-CAPILLARY: 115 mg/dL — AB (ref 65–99)
Glucose-Capillary: 120 mg/dL — ABNORMAL HIGH (ref 65–99)
Glucose-Capillary: 133 mg/dL — ABNORMAL HIGH (ref 65–99)

## 2016-12-02 LAB — BASIC METABOLIC PANEL
ANION GAP: 7 (ref 5–15)
BUN: 11 mg/dL (ref 6–20)
CO2: 27 mmol/L (ref 22–32)
Calcium: 8.4 mg/dL — ABNORMAL LOW (ref 8.9–10.3)
Chloride: 99 mmol/L — ABNORMAL LOW (ref 101–111)
Creatinine, Ser: 0.74 mg/dL (ref 0.61–1.24)
GFR calc non Af Amer: >60 mL/min (ref >60)
GLUCOSE: 129 mg/dL — AB (ref 65–99)
Potassium: 3.9 mmol/L (ref 3.5–5.1)
Sodium: 133 mmol/L — ABNORMAL LOW (ref 135–145)

## 2016-12-02 LAB — PROTIME-INR
INR: 1.54
Prothrombin Time: 18.6 seconds — ABNORMAL HIGH (ref 11.4–15.2)

## 2016-12-02 LAB — CBC
HCT: 28.7 % — ABNORMAL LOW (ref 39.0–52.0)
HEMOGLOBIN: 8.9 g/dL — AB (ref 13.0–17.0)
MCH: 29 pg (ref 26.0–34.0)
MCHC: 31 g/dL (ref 30.0–36.0)
MCV: 93.5 fL (ref 78.0–100.0)
Platelets: 320 10*3/uL (ref 150–400)
RBC: 3.07 MIL/uL — AB (ref 4.22–5.81)
RDW: 15.5 % (ref 11.5–15.5)
WBC: 7.6 10*3/uL (ref 4.0–10.5)

## 2016-12-02 MED ORDER — MUSCLE RUB 10-15 % EX CREA
TOPICAL_CREAM | Freq: Two times a day (BID) | CUTANEOUS | Status: DC | PRN
Start: 2016-12-02 — End: 2016-12-03
  Filled 2016-12-02: qty 85

## 2016-12-02 MED ORDER — TROLAMINE SALICYLATE 10 % EX CREA: TOPICAL_CREAM | Freq: Two times a day (BID) | CUTANEOUS | Status: DC | PRN

## 2016-12-02 MED ORDER — WARFARIN SODIUM 7.5 MG PO TABS
7.5000 mg | ORAL_TABLET | Freq: Once | ORAL | Status: AC
  Administered 2016-12-02: 7.5 mg via ORAL
  Filled 2016-12-02: qty 1

## 2016-12-02 MED ORDER — DICLOFENAC SODIUM 1 % TD GEL
2.0000 g | Freq: Four times a day (QID) | TRANSDERMAL | Status: DC
  Administered 2016-12-02 – 2016-12-03 (×4): 2 g via TOPICAL
  Filled 2016-12-02: qty 100

## 2016-12-02 NOTE — Progress Notes (Signed)
Pt asked me to call son Kathlene NovemberMike and make him aware he was in the hospital. I spoke with Kathlene NovemberMike and gave him pts room information. Emelda Brothershristy Tarika Mckethan RN

## 2016-12-02 NOTE — NC FL2 (Signed)
[place] MEDICAID FL2 LEVEL OF CARE SCREENING TOOL     IDENTIFICATION  Patient Name: Garrett Stewart Birthdate: [birth year] Sex: male Admission Date (Current Location): 11/30/2016  [facility] [place]   Facility and Address[facility]  [facility], [address], [place], [place] [address]      Provider Number: [id number]  Attending Physician Name and Address:  Edsel PetrinMaryann Mikhail, DO  Relative Name and Phone Number:       Current Level of Care: Hospital Recommended Level of Care: Skilled Nursing Facility Prior Approval Number:    Date Approved/Denied:   PASRR Number: [id number] [id number]    Discharge Plan: SNF    Current Diagnoses: Patient Active Problem List   Diagnosis Date Noted  . Acute encephalopathy 11/30/2016  . Chronic atrial fibrillation (HCC) 11/30/2016  . Chronic ITP (idiopathic thrombocytopenia) (HCC) 11/30/2016  . Hypertension 11/30/2016  . Parkinson's disease (HCC) 11/30/2016  . Normocytic normochromic anemia 11/30/2016  . History of pulmonary embolism 11/30/2016    Orientation RESPIRATION BLADDER Height & Weight     Self, Time, Situation, Place  Normal Incontinent Weight: 236 lb 4.8 oz (107.2 kg) Height:  6' (182.9 cm)  BEHAVIORAL SYMPTOMS/MOOD NEUROLOGICAL BOWEL NUTRITION STATUS      Continent  (Please see discharge summary)  AMBULATORY STATUS COMMUNICATION OF NEEDS Skin   Extensive Assist Verbally Normal                       Personal Care Assistance Level of Assistance  Bathing, Feeding, Dressing Bathing Assistance: Maximum assistance Feeding assistance: Limited assistance Dressing Assistance: Maximum assistance     Functional Limitations Info  Sight, Hearing, Speech Sight Info: Adequate Hearing Info: Adequate Speech Info: Adequate    SPECIAL CARE FACTORS FREQUENCY  PT (By licensed PT), OT (By licensed OT)     PT Frequency: 2x week OT Frequency: 2x week            Contractures Contractures Info:  Not present    Additional Factors Info  Code Status, Allergies Code Status Info: Full Code Allergies Info: Statins           Current Medications (12/02/2016):  This is the current hospital active medication list Current Facility-Administered Medications  Medication Dose Route Frequency Provider Last Rate Last Dose  . acetaminophen (TYLENOL) tablet 650 mg  650 mg Oral Q6H PRN Eduard ClosArshad N Kakrakandy, MD       Or  . acetaminophen (TYLENOL) suppository 650 mg  650 mg Rectal Q6H PRN Eduard ClosArshad N Kakrakandy, MD      . atenolol (TENORMIN) tablet 100 mg  100 mg Oral Daily Maryann Mikhail, DO   100 mg at 12/02/16 1206  . B-complex with vitamin C tablet 1 tablet  1 tablet Oral Daily Maryann Mikhail, DO   1 tablet at 12/02/16 1207  . carbidopa-levodopa (SINEMET IR) 25-100 MG per tablet immediate release 1 tablet  1 tablet Oral 2 times per day Edsel PetrinMaryann Mikhail, DO   1 tablet at 12/02/16 1207  . carbidopa-levodopa (SINEMET IR) 25-100 MG per tablet immediate release 2 tablet  2 tablet Oral BID WC Maryann Mikhail, DO   2 tablet at 12/02/16 0818  . darifenacin (ENABLEX) 24 hr tablet 7.5 mg  7.5 mg Oral Daily Maryann Mikhail, DO   7.5 mg at 12/02/16 1207  . diclofenac sodium (VOLTAREN) 1 % transdermal gel 2 g  2 g Topical QID Maryann Mikhail, DO      . DULoxetine (CYMBALTA) DR  capsule 60 mg  60 mg Oral Daily Maryann Mikhail, DO   60 mg at 12/02/16 1207  . finasteride (PROSCAR) tablet 5 mg  5 mg Oral Daily Maryann Mikhail, DO   5 mg at 12/02/16 1207  . ondansetron (ZOFRAN) tablet 4 mg  4 mg Oral Q6H PRN Eduard Clos, MD       Or  . ondansetron [facility]) injection 4 mg  4 mg Intravenous Q6H PRN Eduard Clos, MD      . polyethylene glycol (MIRALAX / GLYCOLAX) packet 17 g  17 g Oral Daily PRN Maryann Mikhail, DO      . tamsulosin (FLOMAX) capsule 0.4 mg  0.4 mg Oral Daily Maryann Mikhail, DO   0.4 mg at 12/02/16 1206  . Warfarin - Pharmacist Dosing Inpatient   Does not apply q1800 Edsel Petrin, DO          Discharge Medications: Please see discharge summary for a list of discharge medications.  Relevant Imaging Results:  Relevant Lab Results:   Additional Information SSN: [id number]  Volney American, LCSW

## 2016-12-02 NOTE — Evaluation (Signed)
Occupational Therapy Evaluation Patient Details Name: Garrett Stewart MRN: [id number] DOB: [birth year] Today's Date: 12/02/2016    History of Present Illness pt presents with AMS and Encephalopathy with possible medication OD.  pt with hx f Parkinson's, A-fib, PE, and CVA.     Clinical Impression    Per pt, he occasionally required assist from family with BADL PTA. Currently pt requires mod assist for stand pivot from chair to bed. Pt required multiple attempts to achieve standing position; rocking in chair to attempt to propel himself up. Pt requiring min-mod assist for ADL at this time. Pt presenting with decreased awareness of safety/insight into his deficits, impaired cognition, poor balance, deconditioning, generalized weakness, and hx of falls impacting his independence and safety with ADL and functional mobility. At this time feel pt is a very high safety and fall risk and is not safe to return home alone; therefore recommending SNF for follow up to maximize independence and safety with ADL and functional mobility prior to return home. Pt would benefit from continued skilled OT to address established goals.    Follow Up Recommendations  SNF;Supervision/Assistance - 24 hour    Equipment Recommendations  Other (comment) (TBD at next venue)    Recommendations for Other Services       Precautions / Restrictions Precautions Precautions: Fall Precaution Comments: R UE painful since fall. Restrictions Weight Bearing Restrictions: No      Mobility Bed Mobility Overal bed mobility: Needs Assistance Bed Mobility: Sit to Supine       Sit to supine: Mod assist   General bed mobility comments: Mod assist to manage LEs back to bed and for repositioning toward HOB.  Transfers Overall transfer level: Needs assistance Equipment used: Rolling walker (2 wheeled) Transfers: Sit to/from [employer] Sit to Stand: Mod assist Stand pivot transfers: Mod assist        General transfer comment: Multiple attempts at rocking to stand up from chair. Pt able to achieve eventually with mod physical assist provided. Pt with difficulty lifting feet to take small pivotal steps back to bed. Cues for safety and sequencing throughout.    Balance Overall balance assessment: Needs assistance;History of Falls Sitting-balance support: Feet supported;No upper extremity supported Sitting balance-Leahy Scale: Fair     Standing balance support: Bilateral upper extremity supported Standing balance-Leahy Scale: Poor Standing balance comment: RW for support throughout                            ADL Overall ADL's : Needs assistance/impaired Eating/Feeding: Set up;Sitting   Grooming: Min guard;Sitting   Upper Body Bathing: Minimal assistance;Sitting   Lower Body Bathing: Moderate assistance;Sit to/from stand   Upper Body Dressing : Minimal assistance;Sitting   Lower Body Dressing: Moderate assistance;Sit to/from stand Lower Body Dressing Details (indicate cue type and reason): Able to adjust socks in sitting. Anticipate he would require assist in standing to pull pants up Toilet Transfer: Moderate assistance;Stand-pivot;BSC;RW Toilet Transfer Details (indicate cue type and reason): Simulated by stand pivot from chair to bed         Functional mobility during ADLs: Moderate assistance;Rolling walker (for stand pivot only) General ADL Comments: Pt unsafe with transfers and does not want therapist assist despite difficulty completing transfer on his own. Required multiple rocking attempts in chair prior to being able to stand up.     Vision         Perception     Praxis  Pertinent Vitals/Pain Pain Assessment: Faces Faces Pain Scale: Hurts a little bit Pain Location: R shoulder Pain Descriptors / Indicators: Grimacing;Sore Pain Intervention(s): Monitored during session;Repositioned     Hand Dominance Right   Extremity/Trunk Assessment  Upper Extremity Assessment Upper Extremity Assessment: Generalized weakness;Difficult to assess due to impaired cognition   Lower Extremity Assessment Lower Extremity Assessment: Defer to PT evaluation   Cervical / Trunk Assessment Cervical / Trunk Assessment: Kyphotic   Communication Communication Communication: Other (comment) (difficult to understand at times)   Cognition Arousal/Alertness: Awake/alert Behavior During Therapy: WFL for tasks assessed/performed Overall Cognitive Status: No family/caregiver present to determine baseline cognitive functioning Area of Impairment: Orientation;Attention;Memory;Following commands;Safety/judgement;Awareness;Problem solving Orientation Level: Disoriented to;Place Current Attention Level: Selective Memory: Decreased recall of precautions;Decreased short-term memory Following Commands: Follows multi-step commands inconsistently Safety/Judgement: Decreased awareness of safety;Decreased awareness of deficits Awareness: Emergent Problem Solving: Slow processing;Decreased initiation;Difficulty sequencing;Requires verbal cues;Requires tactile cues General Comments: Pt making off topic comments throughout session.    General Comments       Exercises       Shoulder Instructions      Home Living Family/patient expects to be discharged to:: Private residence Living Arrangements: Alone                               Additional Comments: pt lives alone and indicates sometimes he uses his walkers and sometimes he doesn't.      Prior Functioning/Environment Level of Independence: Needs assistance  Gait / Transfers Assistance Needed: pt indicates having walkers and W/Cs that he uses at times.   ADL's / Homemaking Assistance Needed: Pt reports his son and daughter in law assist with ADL occasionally, does not provide much detail            OT Problem List: Decreased strength;Decreased activity tolerance;Decreased range of  motion;Impaired balance (sitting and/or standing);Decreased cognition;Decreased safety awareness;Decreased knowledge of use of DME or AE;Decreased knowledge of precautions;Impaired UE functional use;Pain      OT Treatment/Interventions: Self-care/ADL training;Therapeutic exercise;Energy conservation;DME and/or AE instruction;Therapeutic activities;Cognitive remediation/compensation;Patient/family education;Balance training    OT Goals(Current goals can be found in the care plan section) Acute Rehab OT Goals Patient Stated Goal: get back to bed OT Goal Formulation: With patient Time For Goal Achievement: 12/16/16 Potential to Achieve Goals: Good ADL Goals Pt Will Perform Grooming: with min guard assist;standing (x2 tasks) Pt Will Transfer to Toilet: with min assist;ambulating;bedside commode (over toilet) Pt Will Perform Toileting - Clothing Manipulation and hygiene: with min guard assist;sit to/from stand Additional ADL Goal #1: Pt will demonstrate anticipatory awareness during ADL without cues.  OT Frequency: Min 2X/week   Barriers to D/C: Decreased caregiver support  pt lives alone       Co-evaluation              End of Session Equipment Utilized During Treatment: Engineer, water Communication: Mobility status  Activity Tolerance: Patient tolerated treatment well Patient left: in bed;with call bell/phone within reach;with bed alarm set  OT Visit Diagnosis: Unsteadiness on feet (R26.81);Other abnormalities of gait and mobility (R26.89);Repeated falls (R29.6);Muscle weakness (generalized) (M62.81);History of falling (Z91.81)                ADL either performed or assessed with clinical judgement  Time: 1354-1407 OT Time Calculation (min): 13 min Charges:  OT General Charges $OT Visit: 1 Procedure OT Evaluation $OT Eval Moderate Complexity: 1 Procedure G-Codes:     Alexandrina Fiorini A.  Brett Albinooffey, M.S., OTR/L Pager: [phone]  Gaye AlkenBailey A Asiel Chrostowski 12/02/2016, 2:21 PM

## 2016-12-02 NOTE — Evaluation (Signed)
Physical Therapy Evaluation Patient Details Name: Garrett Stewart MRN: [id number] DOB: 1934-10-01 Today's Date: 12/02/2016   History of Present Illness  pt presents with AMS and Encephalopathy with possible medication OD.  pt with hx f Parkinson's, A-fib, PE, and CVA.    Clinical Impression  Pt generally confused and at times makes comments completely off topic such as asking PT if she had a "whole room full of people" and stating that his girlfriend "did me wrong" and that was why he was in the hospital.  Pt insisting on returning to home and when PT indicated pt couldn't even stand up he stated it was because he sat up on the wrong side of the bed.  So, PT allowed him to attempt to sit towards other side of the bed and pt only able to semi-roll on his L side without A before giving up.  Strongly feel pt is not safe for return to home and that he will need SNF level of care at D/C.      Follow Up Recommendations SNF    Equipment Recommendations  None recommended by PT    Recommendations for Other Services       Precautions / Restrictions Precautions Precautions: Fall Precaution Comments: R UE painful since fall. Restrictions Weight Bearing Restrictions: No      Mobility  Bed Mobility Overal bed mobility: Needs Assistance Bed Mobility: Supine to Sit;Sit to Supine     Supine to sit: Mod assist;HOB elevated Sit to supine: Mod assist   General bed mobility comments: With [facility] fully elevated pt needed A with bringing LEs OOB and scooting hips closer to EOB.  Then needed A for Bil LEs to return to bed.  pt indicating he could sit up better going towards his L side, so allowed pt to try to sit on his own towards his L and pt only able to semi-roll to L side before giving up.  pt very effortful with any movement.    Transfers                 General transfer comment: pt attempted to stand and kept refusing to allow PT to A.  pt unable to even remotely clear hips from bed  despite rocking to use momentum and maximum effort.    Ambulation/Gait                Stairs            Wheelchair Mobility    Modified Rankin (Stroke Patients Only)       Balance Overall balance assessment: History of Falls;Needs assistance Sitting-balance support: Single extremity supported;Bilateral upper extremity supported;No upper extremity supported;Feet supported Sitting balance-Leahy Scale: Fair Sitting balance - Comments: Needs UE support for any dynamic balance. Postural control: Posterior lean                                   Pertinent Vitals/Pain Pain Assessment: No/denies pain Faces Pain Scale: Hurts even more Pain Location: right shoulder Pain Descriptors / Indicators: Guarding;Aching;Grimacing;Discomfort Pain Intervention(s): Monitored during session;Premedicated before session;Repositioned    Home Living Family/patient expects to be discharged to:: Skilled nursing facility                 Additional Comments: pt lives alone and indicates sometimes he uses his walkers and sometimes he doesn't.    Prior Function Level of Independence: Needs assistance   Gait / Transfers  Assistance Needed: pt indicates having walkers and W/Cs that he uses at times.    ADL's / Homemaking Assistance Needed: Unclear as pt mentions his girlfriend "helps" him some, but doesn't give details.          Hand Dominance   Dominant Hand: Right    Extremity/Trunk Assessment   Upper Extremity Assessment Upper Extremity Assessment: Defer to OT evaluation    Lower Extremity Assessment Lower Extremity Assessment: Generalized weakness;RLE deficits/detail;LLE deficits/detail;Difficult to assess due to impaired cognition RLE Deficits / Details: Sensation intact, strength grossly 2/5. RLE Coordination: decreased fine motor;decreased gross motor LLE Deficits / Details: Sensation intact, strength grossly 2/5. LLE Coordination: decreased fine  motor;decreased gross motor    Cervical / Trunk Assessment Cervical / Trunk Assessment: Kyphotic  Communication   Communication:  (Difficult to understand at times.)  Cognition Arousal/Alertness: Awake/alert Behavior During Therapy: WFL for tasks assessed/performed Overall Cognitive Status: Impaired/Different from baseline Area of Impairment: Orientation;Attention;Memory;Following commands;Safety/judgement;Awareness;Problem solving Orientation Level: Disoriented to;Place;Time;Situation (pt at times knew hospital, but also indicated clinic) Current Attention Level: Selective Memory: Decreased short-term memory;Decreased recall of precautions Following Commands: Follows one step commands with increased time;Follows multi-step commands inconsistently Safety/Judgement: Decreased awareness of safety;Decreased awareness of deficits Awareness: Emergent Problem Solving: Slow processing;Decreased initiation;Difficulty sequencing;Requires verbal cues;Requires tactile cues General Comments: Unclear pt's baseline level of cognition, however pt making comments off topic and makes a comment about his girlfriend "did me wrong" and that is why he was in hospital.      General Comments      Exercises     Assessment/Plan    PT Assessment Patient needs continued PT services  PT Problem List Decreased strength;Decreased activity tolerance;Decreased balance;Decreased mobility;Decreased coordination;Decreased cognition;Decreased knowledge of use of DME;Decreased safety awareness;Decreased knowledge of precautions;Obesity;Pain       PT Treatment Interventions DME instruction;Gait training;Functional mobility training;Therapeutic activities;Therapeutic exercise;Balance training;Cognitive remediation;Patient/family education    PT Goals (Current goals can be found in the Care Plan section)  Acute Rehab PT Goals Patient Stated Goal: Home PT Goal Formulation: With patient Time For Goal Achievement:  12/16/16 Potential to Achieve Goals: Good    Frequency Min 2X/week   Barriers to discharge Decreased caregiver support      Co-evaluation               End of Session   Activity Tolerance: Patient limited by fatigue Patient left: in bed;with call bell/phone within reach;with bed alarm set Nurse Communication: Mobility status;Need for lift equipment PT Visit Diagnosis: Muscle weakness (generalized) (M62.81);History of falling (Z91.81);Difficulty in walking, not elsewhere classified (R26.2)         Time: [id number] PT Time Calculation (min) (ACUTE ONLY): 37 min   Charges:   PT Evaluation $PT Eval Moderate Complexity: 1 Procedure PT Treatments $Therapeutic Activity: 8-22 mins   PT G CodesSunny Schlein:         Anel Creighton F Hazell Siwik, PT  [phone] 12/02/2016, 9:47 AM

## 2016-12-02 NOTE — Evaluation (Signed)
Clinical/Bedside Swallow Evaluation Patient Details  Name: Garrett Stewart MRN: [id number] Date of Birth: 14-Jun-1935  Today's Date: 12/02/2016 Time: SLP Start Time (ACUTE ONLY): 0840 SLP Stop Time (ACUTE ONLY): 0905 SLP Time Calculation (min) (ACUTE ONLY): 25 min  Past Medical History:  Past Medical History:  Diagnosis Date  . A-fib (HCC)   . Chronic ITP (idiopathic thrombocytopenic purpura) (HCC)   . Parkinson's disease (HCC)   . Stroke [facility](HCC)    Past Surgical History:  Past Surgical History:  Procedure Laterality Date  . CHOLECYSTECTOMY     HPI:  Garrett Stewart a 82 y.o.malewith history of Parkinson's disease, CVA, atrial fibrillation, chronic ITP, history of pulmonary embolism was brought to the ER after patient was getting increasingly confused and agitated. Admitted for acute encephalopathy. CT, MRI of the head was unremarkable. Chest x-ray UA unremarkable. No prior swallowing evaluations in chart.   Assessment / Plan / Recommendation Clinical Impression  Upon SLP entry to room, patient had just completed breakfast (regular, thin liquids) with assistance of CNA, who reported, "He's coughing some." Patient with explosive coughing, increased work of breathing, wet vocal quality. He states, "I know why you're here, but you're in the wrong room." States he's had swallowing tests at OSH and was recommended to use thickened liquids, which he does not do. States, "I'm almost 82 years old, I'm going to eat and drink whatever I want to." With 3 oz water swallow challenge, patient with multiple swallows, wet vocal quality, delayed cough and increased work of breathing, suggestive of reduced airway protection. Coughing and wet vocal quality persists throughout the assessment; suspect aspiration of secretions. Provided education regarding instrumental assessment to aid in determining safest, least restrictive diet, aid in treatment planning to address physiologic impairments, and identify  compensatory strategies to improve swallowing safety. Patient verbalizes understanding and is in agreement to proceed with MBS. Scheduled for 10am, goals will be updated upon completion of instrumental.  SLP Visit Diagnosis: Dysphagia, unspecified (R13.10)    Aspiration Risk  Severe aspiration risk    Diet Recommendation Other (Comment) (defer pending instrumental assessment)        Other  Recommendations Oral Care Recommendations: Oral care QID   Follow up Recommendations        Frequency and Duration min 2x/week  2 weeks       Prognosis Prognosis for Safe Diet Advancement: Fair Barriers to Reach Goals: Time post onset;Motivation;Behavior      Swallow Study   General Date of Onset: 11/30/16 HPI: Garrett Stewart a 82 y.o.malewith history of Parkinson's disease, CVA, atrial fibrillation, chronic ITP, history of pulmonary embolism was brought to the ER after patient was getting increasingly confused and agitated. Admitted for acute encephalopathy. CT, MRI of the head was unremarkable. Chest x-ray UA unremarkable. No prior swallowing evaluations in chart. Type of Study: Bedside Swallow Evaluation Previous Swallow Assessment: none in chart Diet Prior to this Study: Regular;Thin liquids Temperature Spikes Noted: No Respiratory Status: Room air History of Recent Intubation: No Behavior/Cognition: Alert;Cooperative;Impulsive Oral Cavity Assessment: Within Functional Limits Oral Care Completed by SLP: No Oral Cavity - Dentition: Dentures, top;Dentures, bottom Vision: Functional for self-feeding Self-Feeding Abilities: Able to feed self Patient Positioning: Upright in bed Baseline Vocal Quality: Wet;Hoarse Volitional Cough: Strong Volitional Swallow: Able to elicit    Oral/Motor/Sensory Function Overall Oral Motor/Sensory Function: Within functional limits   Ice Chips Ice chips: Not tested   Thin Liquid Thin Liquid: Impaired Presentation: Cup;Self Fed Oral Phase  Impairments: Reduced labial seal Oral Phase  Functional Implications: Right anterior spillage Pharyngeal  Phase Impairments: Multiple swallows;Wet Vocal Quality;Cough - Delayed;Change in Vital Signs    Nectar Thick Nectar Thick Liquid: Not tested   Honey Thick     Puree Puree: Impaired Presentation: Spoon Pharyngeal Phase Impairments: Cough - Immediate;Wet Vocal Quality;Change in Vital Signs   Solid   GO   Solid: Impaired Presentation: Self Fed Pharyngeal Phase Impairments: Cough - Immediate       Rondel Baton, MS CF-SLP Speech-Language Pathologist [phone]  Arlana Lindau 12/02/2016,9:22 AM

## 2016-12-02 NOTE — Progress Notes (Addendum)
PROGRESS NOTE    Garrett Stewart  NWG:[id number] DOB: [birth year] DOA: 11/30/2016 PCP: Paulino Door, MD   Chief Complaint  Patient presents with  . Fall     Brief Narrative:  HPI on 11/30/2016 by Dr. Midge Minium Garrett Stewart is a 82 y.o. male with history of Parkinson's disease, atrial fibrillation, chronic ITP, history of pulmonary embolism was brought to the ER after patient was getting increasingly confused and agitated. As per the patient's family there was no change in medications recently. Patient was afebrile in the ER.   Assessment & Plan   Acute encephalopathy -Suspected secondary to overuse of home medications: patient admitted to taking 2 robaxin, 4 norco and 2 oxy within 30 min -Currently afebrile, no leukocytosis -CT head/MRI brain unremarkable for acute abnormalities -UA and CXR unremarkable for infection -Drug screen +opiates, Alcohol level <5 -Patient was given ativan overnight for MRI and agitation -AMS resolved, currently at baseline  Parkinson's diseaase -Continue sinemet   Atrial fibrillation/history of PE -CHADSVASC 6 -discontinued heparin, placed back on coumadin -INR currently subtherapeutic 1.54 -Discussed not being on coumadin with patient given his frequent and recurrent falls, he states he understands the risks  History ITP/ Iron deficiency anemia -No labs for comparison -Continue to monitor CBC  Recurrent falls -PT and OT consulted, recommended SNF -Patient would like to go home but will consider SNF if he does not have to pay -SW consulted  DVT Prophylaxis coumadin  Code Status: Full  Family Communication: none at bedside  Disposition Plan: Admitted. Pending possible placement  Consultants None  Procedures  none  Antibiotics   Anti-infectives    None      Subjective:   Garrett Stewart seen and examined today.  Complains of arm pain and would like some cream. Denies chest pain, shortness of breath, abdominal pain,  nausea or vomiting, diarrhea or constipation.   Objective:   Vitals:   12/01/16 2349 12/02/16 0511 12/02/16 0758 12/02/16 1148  BP: 111/66 (!) 109/57 125/68 (!) 128/58  Pulse: 85 81 75 75  Resp: (!) 23 20 (!) 26 18  Temp: 97.6 F (36.4 C) 97.8 F (36.6 C) 97.9 F (36.6 C) 98.4 F (36.9 C)  TempSrc: Oral Oral Oral Oral  SpO2: 96% 97% 93% 99%  Weight:  107.2 kg (236 lb 4.8 oz)    Height:        Intake/Output Summary (Last 24 hours) at 12/02/16 1541 Last data filed at 12/02/16 0843  Gross per 24 hour  Intake            352.2 ml  Output              450 ml  Net            -97.8 ml   Filed Weights   11/30/16 1554 12/01/16 1000 12/02/16 0511  Weight: 108 kg (238 lb) 108.4 kg (238 lb 14.4 oz) 107.2 kg (236 lb 4.8 oz)    Exam  General: Well developed, well nourished, NAD  HEENT: NCAT,  mucous membranes moist.   Cardiovascular: S1 S2 auscultated, no rubs, murmurs or gallops. irregular  Respiratory: Clear to auscultation bilaterally with equal chest rise  Abdomen: Soft, nontender, nondistended, + bowel sounds  Extremities: warm dry without cyanosis clubbing or edema. Bruising and swelling of RUE  Skin: Patient has multiple lesions, abrasions, bruises  Neuro:AAOx3, nonfocal  Psych: Appropriate mood and affect   Data Reviewed: I have personally reviewed following labs and imaging studies  CBC:  Recent Labs  Lab 11/30/16 1546 12/01/16 0358 12/02/16 0520  WBC 7.9 7.7 7.6  NEUTROABS 5.0  --   --   HGB 10.2* 9.5* 8.9*  HCT 31.8* 30.9* 28.7*  MCV 93.8 93.6 93.5  PLT 289 294 320   Basic Metabolic Panel:  Recent Labs Lab 11/30/16 1546 12/01/16 0358 12/02/16 0520  NA 135 134* 133*  K 4.0 3.9 3.9  CL 95* 97* 99*  CO2 30 28 27   GLUCOSE 97 98 129*  BUN 16 14 11   CREATININE 0.83 0.76 0.74  CALCIUM 9.0 8.6* 8.4*   GFR: Estimated Creatinine Clearance: 94.7 mL/min (by C-G formula based on SCr of 0.74 mg/dL). Liver Function Tests:  Recent Labs Lab  11/30/16 1546 12/01/16 0358  AST 21 19  ALT <5* 11*  ALKPHOS 73 71  BILITOT 1.5* 1.8*  PROT 7.3 7.2  ALBUMIN 3.3* 3.1*   No results for input(s): LIPASE, AMYLASE in the last 168 hours.  Recent Labs Lab 11/30/16 2101  AMMONIA 17   Coagulation Profile:  Recent Labs Lab 11/30/16 1546 12/02/16 1215  INR 1.79 1.54   Cardiac Enzymes:  Recent Labs Lab 11/30/16 1546  TROPONINI <0.03   BNP (last 3 results) No results for input(s): PROBNP in the last 8760 hours. HbA1C: No results for input(s): HGBA1C in the last 72 hours. CBG:  Recent Labs Lab 12/01/16 1626 12/01/16 2022 12/01/16 2353 12/02/16 0530 12/02/16 1134  GLUCAP 99 122* 109* 133* 107*   Lipid Profile: No results for input(s): CHOL, HDL, LDLCALC, TRIG, CHOLHDL, LDLDIRECT in the last 72 hours. Thyroid Function Tests:  Recent Labs  12/01/16 0056  TSH 1.807   Anemia Panel: No results for input(s): VITAMINB12, FOLATE, FERRITIN, TIBC, IRON, RETICCTPCT in the last 72 hours. Urine analysis:    Component Value Date/Time   COLORURINE YELLOW 11/30/2016 1820   APPEARANCEUR CLEAR 11/30/2016 1820   LABSPEC 1.018 11/30/2016 1820   PHURINE 5.0 11/30/2016 1820   GLUCOSEU NEGATIVE 11/30/2016 1820   HGBUR NEGATIVE 11/30/2016 1820   BILIRUBINUR NEGATIVE 11/30/2016 1820   KETONESUR NEGATIVE 11/30/2016 1820   PROTEINUR NEGATIVE 11/30/2016 1820   NITRITE NEGATIVE 11/30/2016 1820   LEUKOCYTESUR NEGATIVE 11/30/2016 1820   Sepsis Labs: @LABRCNTIP (procalcitonin:4,lacticidven:4)  )No results found for this or any previous visit (from the past 240 hour(s)).    Radiology Studies: Ct Head Wo Contrast  Result Date: 11/30/2016 CLINICAL DATA:  Altered mental status today. EXAM: CT HEAD WITHOUT CONTRAST TECHNIQUE: Contiguous axial images were obtained from the base of the skull through the vertex without intravenous contrast. COMPARISON:  Head CT scan 09/10/2016. FINDINGS: Brain: There is no evidence of acute intracranial  abnormality including hemorrhage, infarct, mass lesion, mass effect, midline shift or abnormal extra-axial fluid collection. No hydrocephalus or pneumocephalus. Vascular: Mild atherosclerosis is noted. Skull: Intact. Sinuses/Orbits: Mild mucosal thickening is seen in the sphenoid sinuses. Otherwise negative. Other: None. IMPRESSION: No acute abnormality. Electronically Signed   By: Drusilla Kanner M.D.   On: 11/30/2016 16:49   Mr Brain Wo Contrast  Result Date: 11/30/2016 CLINICAL DATA:  Acute encephalopathy. Frequent falls and slurred speech. On Coumadin. History of Parkinson's disease, atrial fibrillation and stroke. EXAM: MRI HEAD WITHOUT CONTRAST TECHNIQUE: Multiplanar, multiecho pulse sequences of the brain and surrounding structures were obtained without intravenous contrast. Due to patient motion, sagittal T1, coronal T2 not obtained. COMPARISON:  CT HEAD November 30, 2016 at 1640 hours FINDINGS: Moderately motion degraded examination. BRAIN: No reduced diffusion to suggest acute ischemia. No susceptibility artifact to lobar hematoma  though with small hemorrhages would not be appreciated due to motion. Minimal white matter changes most compatible with chronic small vessel ischemic disease. The ventricles and sulci are normal for patient's age. No suspicious parenchymal signal, masses or mass effect. No abnormal extra-axial fluid collections. VASCULAR: Normal major intracranial vascular flow voids present at skull base. SKULL AND UPPER CERVICAL SPINE: No abnormal sellar expansion. No suspicious calvarial bone marrow signal. Craniocervical junction maintained. SINUSES/ORBITS: Small sphenoid sinus air-fluid levels with mild paranasal sinus mucosal thickening. Mastoid air cells are well aerated. The included ocular globes and orbital contents are non-suspicious. OTHER: None. IMPRESSION: Normal noncontrast MRI of the head for age though, limited by moderate motion and incomplete examination. Electronically Signed    By: Awilda Metro M.D.   On: 11/30/2016 22:54   Dg Swallowing Func-speech Pathology  Result Date: 12/02/2016 Objective Swallowing Evaluation: Type of Study: MBS-Modified Barium Swallow Study Patient Details Name: Garion Wempe MRN: [id number] Date of Birth: 1935-01-12 Today's Date: 12/02/2016 Time: SLP Start Time (ACUTE ONLY): 1002-SLP Stop Time (ACUTE ONLY): 1020 SLP Time Calculation (min) (ACUTE ONLY): 18 min Past Medical History: Past Medical History: Diagnosis Date . A-fib (HCC)  . Chronic ITP (idiopathic thrombocytopenic purpura) (HCC)  . Parkinson's disease (HCC)  . Stroke [facility])  Past Surgical History: Past Surgical History: Procedure Laterality Date . CHOLECYSTECTOMY   HPI: Nain Rudd a 82 y.o.malewith history of Parkinson's disease, CVA, atrial fibrillation, chronic ITP, history of pulmonary embolism was brought to the ER after patient was getting increasingly confused and agitated. Admitted for acute encephalopathy. CT, MRI of the head was unremarkable. Chest x-ray UA unremarkable. No prior swallowing evaluations in chart. Subjective: Alert, cooperative, pleasant mood Assessment / Plan / Recommendation CHL IP CLINICAL IMPRESSIONS 12/02/2016 Clinical Impression Patient presents with mild pharyngeal dysphagia secondary to bony protrusion ~C4 which intermittently impedes full epiglottic closure leading to mild vallecular residue which clears with liquid wash. No frank aspiration observed. Noted penetration to the level of the vocal cords x1 due to delayed swallow initiation (pyriform sinuses) with consecutive cup sips of thin liquids. Swallow initiation was timely for straw use with thin liquids and small single cup sips, pureed and regular solids. Barium pill was retained in the valleculae with initial swallow of thin liquid, however clears with subsequent liquid wash. Recommend regular diet with thin liquids, medications whole with plenty of liquid or in puree per pt preference. Patient viewed  video and was instructed to take small single bites and sips, use a slow rate, and intermittently dry swallow to facilitate clearance of vallecular residue. SLP will f/u for diet tolerance and education/instruction in compensatory techniques to maximize swallowing safety.  SLP Visit Diagnosis Dysphagia, pharyngeal phase (R13.13) Attention and concentration deficit following -- Frontal lobe and executive function deficit following -- Impact on safety and function Mild aspiration risk   CHL IP TREATMENT RECOMMENDATION 12/02/2016 Treatment Recommendations Therapy as outlined in treatment plan below   Prognosis 12/02/2016 Prognosis for Safe Diet Advancement Good Barriers to Reach Goals -- Barriers/Prognosis Comment -- CHL IP DIET RECOMMENDATION 12/02/2016 SLP Diet Recommendations Regular solids;Thin liquid Liquid Administration via Straw Medication Administration Whole meds with liquid Compensations Slow rate;Small sips/bites;Other (Comment) Postural Changes Seated upright at 90 degrees   CHL IP OTHER RECOMMENDATIONS 12/02/2016 Recommended Consults -- Oral Care Recommendations Oral care BID Other Recommendations --   CHL IP FOLLOW UP RECOMMENDATIONS 12/02/2016 Follow up Recommendations None   CHL IP FREQUENCY AND DURATION 12/02/2016 Speech Therapy Frequency (ACUTE ONLY) min 1 x/week Treatment  Duration 1 week      CHL IP ORAL PHASE 12/02/2016 Oral Phase WFL Oral - Pudding Teaspoon -- Oral - Pudding Cup -- Oral - Honey Teaspoon -- Oral - Honey Cup -- Oral - Nectar Teaspoon -- Oral - Nectar Cup -- Oral - Nectar Straw -- Oral - Thin Teaspoon -- Oral - Thin Cup -- Oral - Thin Straw -- Oral - Puree -- Oral - Mech Soft -- Oral - Regular -- Oral - Multi-Consistency -- Oral - Pill -- Oral Phase - Comment --  CHL IP PHARYNGEAL PHASE 12/02/2016 Pharyngeal Phase Impaired Pharyngeal- Pudding Teaspoon -- Pharyngeal -- Pharyngeal- Pudding Cup -- Pharyngeal -- Pharyngeal- Honey Teaspoon -- Pharyngeal -- Pharyngeal- Honey Cup -- Pharyngeal --  Pharyngeal- Nectar Teaspoon -- Pharyngeal -- Pharyngeal- Nectar Cup -- Pharyngeal -- Pharyngeal- Nectar Straw -- Pharyngeal -- Pharyngeal- Thin Teaspoon Pharyngeal residue - valleculae;Reduced epiglottic inversion Pharyngeal Material does not enter airway Pharyngeal- Thin Cup Penetration/Aspiration during swallow;Delayed swallow initiation-pyriform sinuses;Pharyngeal residue - valleculae;Pharyngeal residue - pyriform;Reduced epiglottic inversion Pharyngeal Material enters airway, CONTACTS cords and not ejected out Pharyngeal- Thin Straw Reduced epiglottic inversion;Pharyngeal residue - valleculae Pharyngeal Material does not enter airway Pharyngeal- Puree Reduced epiglottic inversion;Pharyngeal residue - valleculae Pharyngeal -- Pharyngeal- Mechanical Soft -- Pharyngeal -- Pharyngeal- Regular Reduced epiglottic inversion;Pharyngeal residue - valleculae Pharyngeal -- Pharyngeal- Multi-consistency -- Pharyngeal -- Pharyngeal- Pill Reduced epiglottic inversion;Pharyngeal residue - valleculae Pharyngeal -- Pharyngeal Comment --  CHL IP CERVICAL ESOPHAGEAL PHASE 12/02/2016 Cervical Esophageal Phase WFL Pudding Teaspoon -- Pudding Cup -- Honey Teaspoon -- Honey Cup -- Nectar Teaspoon -- Nectar Cup -- Nectar Straw -- Thin Teaspoon -- Thin Cup -- Thin Straw -- Puree -- Mechanical Soft -- Regular -- Multi-consistency -- Pill -- Cervical Esophageal Comment -- Rondel BatonMary Beth Bardin, MS CF-SLP Speech-Language Pathologist [phone] No flowsheet data found. Arlana LindauMary E Bardin 12/02/2016, 10:57 AM                Scheduled Meds: . atenolol  100 mg Oral Daily  . B-complex with vitamin C  1 tablet Oral Daily  . carbidopa-levodopa  1 tablet Oral 2 times per day  . carbidopa-levodopa  2 tablet Oral BID WC  . darifenacin  7.5 mg Oral Daily  . diclofenac sodium  2 g Topical QID  . DULoxetine  60 mg Oral Daily  . finasteride  5 mg Oral Daily  . tamsulosin  0.4 mg Oral Daily  . warfarin  7.5 mg Oral ONCE-1800  . Warfarin - Pharmacist  Dosing Inpatient   Does not apply q1800   Continuous Infusions:    LOS: 1 day   Time Spent in minutes   30 minutes  Chastelyn Athens D.O. on 12/02/2016 at 3:41 PM  Between 7am to 7pm - Pager - [phone]  After 7pm go to [email or web address] - password [email or web address]  And look for the night coverage person covering for me after hours  [facility] Office  [phone]

## 2016-12-02 NOTE — Progress Notes (Signed)
Modified Barium Swallow Progress Note  Patient Details  Name: Garrett RavelRobert Stewart MRN: [id number] Date of Birth: August 26, 1935  Today's Date: 12/02/2016  Modified Barium Swallow completed.  Full report located under Chart Review in the Imaging Section.  Brief recommendations include the following:  Clinical Impression  Patient presents with mild pharyngeal dysphagia secondary to bony protrusion ~C4 which intermittently impedes full epiglottic closure leading to mild vallecular residue which clears with liquid wash. No frank aspiration observed. Noted penetration to the level of the vocal cords x1 due to delayed swallow initiation (pyriform sinuses) with consecutive cup sips of thin liquids. Swallow initiation was timely for straw use with thin liquids and small single cup sips, pureed and regular solids. Barium pill was retained in the valleculae with initial swallow of thin liquid, however clears with subsequent liquid wash. Recommend regular diet with thin liquids, medications whole with plenty of liquid or in puree per pt preference. Patient viewed video and was instructed to take small single bites and sips, use a slow rate, and intermittently dry swallow to facilitate clearance of vallecular residue. SLP will f/u for diet tolerance and education/instruction in compensatory techniques to maximize swallowing safety.    Swallow Evaluation Recommendations       SLP Diet Recommendations: Regular solids;Thin liquid   Liquid Administration via: Straw   Medication Administration: Whole meds with liquid (one at a time, with plenty of liquid)   Supervision: Patient able to self feed;Intermittent supervision to cue for compensatory strategies   Compensations: Slow rate;Small sips/bites;Other (Comment) (Single sips, dry swallow intermittently)   Postural Changes: Seated upright at 90 degrees   Oral Care Recommendations: Oral care BID     Garrett BatonMary Beth Chandani Stewart, [place] CF-SLP Speech-Language  Pathologist [phone]   Garrett LindauMary E Peytyn Stewart 12/02/2016,10:55 AM

## 2016-12-02 NOTE — Clinical Social Work Note (Signed)
Clinical Social Work Assessment  Patient Details  Name: Garrett RavelRobert Stewart MRN: [id number] Date of Birth: 27-Nov-1934  Date of referral:  12/02/16               Reason for consult:  Facility Placement                Permission sought to share information with:    Permission granted to share information::     Name::        Agency::     Relationship::     Contact Information:     Housing/Transportation Living arrangements for the past 2 months:  [facility] Source of Information:  Patient Patient Interpreter Needed:  None Criminal Activity/Legal Involvement Pertinent to Current Situation/Hospitalization:  No - Comment as needed Significant Relationships:  Friend Lives with:  Self Do you feel safe going back to the place where you live?    Need for family participation in patient care:     Care giving concerns:  No family or friends present at bedside during initial assessment.   Social Worker assessment / plan:  CSW spoke with pt at bedside to complete initial assessment. Pt was previously at [facility] and had to private pay for the last couple of days because he exhausted his SNF days. Pt reports he wants to return there but if he has to private pay he wants to d/c home. Pt reports he has 6/7 days organized so someone is there at home with him. CSW will follow up with facility and RNCM in case the need for [facility] arises.  Employment status:  Retired Health and safety inspectornsurance information:  Medicare PT Recommendations:  Skilled Nursing Facility Information / Referral to community resources:  Skilled Nursing Facility  Patient/Family's Response to care:  Pt verbalized understanding of CSW role and expressed appreciation for support. Pt denies any concern regarding pt care at this time.   Patient/Family's Understanding of and Emotional Response to Diagnosis, Current Treatment, and Prognosis:  Pt understanding and realistic regarding physical limitations. Pt understands the  need for SNF placement at d/c. Pt agreeable to SNF placement at d/c, at this time. Pt's responses emotionally appropriate during conversation with CSW. Pt denies any concern regarding treatment plan at this time. CSW will continue to provide support and facilitate d/c needs.   Emotional Assessment Appearance:  Appears stated age Attitude/Demeanor/Rapport:   (Patient was appropriate.) Affect (typically observed):  Accepting, Calm Orientation:  Oriented to Self, Oriented to Place, Oriented to  Time, Oriented to Situation Alcohol / Substance use:  Not Applicable Psych involvement (Current and /or in the community):  No (Comment)  Discharge Needs  Concerns to be addressed:  Care Coordination, Financial / Insurance Concerns Readmission within the last 30 days:  No Current discharge risk:  Dependent with Mobility, Lack of support system Barriers to Discharge:  Continued Medical Work up, [employer] Authorization   Garrett Keys A Mayton, LCSW 12/02/2016, 3:37 PM

## 2016-12-02 NOTE — Progress Notes (Signed)
ANTICOAGULATION CONSULT NOTE - Initial Consult  Pharmacy Consult for heparin  Indication: AFib  Allergies  Allergen Reactions  . Statins Other (See Comments)    Unknown    Patient Measurements: Height: 6' (182.9 cm) Weight: 236 lb 4.8 oz (107.2 kg) IBW/kg (Calculated) : 77.6 Heparin Dosing Weight: 100 kg  Vital Signs: Temp: 98.4 F (36.9 C) (03/09 1148) Temp Source: Oral (03/09 1148) BP: 128/58 (03/09 1148) Pulse Rate: 75 (03/09 1148)  Labs:  Recent Labs  11/30/16 1546 12/01/16 0358 12/01/16 1059 12/01/16 2119 12/02/16 0520 12/02/16 1215  HGB 10.2* 9.5*  --   --  8.9*  --   HCT 31.8* 30.9*  --   --  28.7*  --   PLT 289 294  --   --  320  --   LABPROT 21.0*  --   --   --   --  18.6*  INR 1.79  --   --   --   --  1.54  HEPARINUNFRC  --   --  0.10* <0.10*  --   --   CREATININE 0.83 0.76  --   --  0.74  --   TROPONINI <0.03  --   --   --   --   --     Estimated Creatinine Clearance: 94.7 mL/min (by C-G formula based on SCr of 0.74 mg/dL).   Medical History: Past Medical History:  Diagnosis Date  . A-fib (HCC)   . Chronic ITP (idiopathic thrombocytopenic purpura) (HCC)   . Parkinson's disease (HCC)   . Stroke [facility](HCC)    Assessment: 82 yo male due to frequent falls and slurred speech, pt is on warfarin PTA for AFib and hx of CVA. Recently admitted at outside hospital due to a fall, though he has a history of frequent falls.  INR drawn on admission was 1.79, hgb trending down, no bleeding noted. AMS improving. Nurse was able to give warfarin last night. Heparin bridge discontinued this am. INR currently 1.5.   Warfarin 5mg  daily noted to be patient's home dose.  Goal of Therapy:  INR goal 2-3 Monitor platelets by anticoagulation protocol: Yes     Plan:  Warfarin 7.5mg  tonight Daily INR for now  Sheppard CoilFrank Camyra Vaeth PharmD., BCPS Clinical Pharmacist Pager [phone] 12/02/2016 1:41 PM

## 2016-12-03 LAB — PROTIME-INR
INR: 1.56
PROTHROMBIN TIME: 18.8 s — AB (ref 11.4–15.2)

## 2016-12-03 LAB — CBC
HCT: 26.6 % — ABNORMAL LOW (ref 39.0–52.0)
Hemoglobin: 8.3 g/dL — ABNORMAL LOW (ref 13.0–17.0)
MCH: 28.9 pg (ref 26.0–34.0)
MCHC: 31.2 g/dL (ref 30.0–36.0)
MCV: 92.7 fL (ref 78.0–100.0)
PLATELETS: 304 10*3/uL (ref 150–400)
RBC: 2.87 MIL/uL — AB (ref 4.22–5.81)
RDW: 15.4 % (ref 11.5–15.5)
WBC: 6.7 10*3/uL (ref 4.0–10.5)

## 2016-12-03 LAB — GLUCOSE, CAPILLARY
Glucose-Capillary: 99 mg/dL (ref 65–99)
Glucose-Capillary: 103 mg/dL — ABNORMAL HIGH (ref 65–99)

## 2016-12-03 LAB — VITAMIN B1: Vitamin B1 (Thiamine): 108.4 nmol/L (ref 66.5–200.0)

## 2016-12-03 MED ORDER — LORAZEPAM 0.5 MG PO TABS
0.5000 mg | ORAL_TABLET | Freq: Once | ORAL | Status: AC
  Administered 2016-12-03: 0.5 mg via ORAL
  Filled 2016-12-03: qty 1

## 2016-12-03 MED ORDER — WARFARIN SODIUM 5 MG PO TABS
5.0000 mg | ORAL_TABLET | Freq: Once | ORAL | Status: AC
  Administered 2016-12-03: 5 mg via ORAL
  Filled 2016-12-03: qty 1

## 2016-12-03 MED ORDER — MUSCLE RUB 10-15 % EX CREA: 1.0000 "application " | TOPICAL_CREAM | Freq: Two times a day (BID) | CUTANEOUS | 0 refills | Status: AC | PRN

## 2016-12-03 NOTE — Discharge Summary (Addendum)
Physician Discharge Summary  Garrett Stewart EPP:[id number] DOB: [birth year] DOA: 11/30/2016  PCP: Paulino Door, MD  Admit date: 11/30/2016 Discharge date: 12/03/2016  Time spent: 45 minutes  Recommendations for Outpatient Follow-up:  Patient will be discharged to skilled nursing facility. Continue physical and occupational therapy..  Patient will need to follow up with primary care provider within one week of discharge, repeat CBC.  Follow up with coumadin clinic on 12/05/16. Patient should continue medications as prescribed.  Patient should follow a heart healthy diet.   Discharge Diagnoses:  Acute encephalopathy Parkinson's diseaase Atrial fibrillation/history of PE History ITP/ Iron deficiency anemia Recurrent falls  Discharge Condition: Stable  Diet recommendation: heart healthy  Filed Weights   12/01/16 1000 12/02/16 0511 12/03/16 0603  Weight: 108.4 kg (238 lb 14.4 oz) 107.2 kg (236 lb 4.8 oz) 109.5 kg (241 lb 8 oz)    History of present illness:  on 11/30/2016 by Dr. Annette Stable a 82 y.o.malewith history of Parkinson's disease, atrial fibrillation, chronic ITP, history of pulmonary embolism was brought to the ER after patient was getting increasingly confused and agitated. As per the patient's family there was no change in medications recently. Patient was afebrile in the ER.   Hospital Course:  Acute encephalopathy -Suspected secondary to overuse of home medications: patient admitted to taking 2 robaxin, 4 norco and 2 oxy within 30 min -Currently afebrile, no leukocytosis -CT head/MRI brain unremarkable for acute abnormalities -UA and CXR unremarkable for infection -Drug screen +opiates, Alcohol level <5 -Patient was given ativan overnight for MRI and agitation -AMS resolved, currently at baseline  Parkinson's diseaase -Continue sinemet   Atrial fibrillation/history of PE -CHADSVASC 6 -discontinued heparin, placed back on coumadin -INR  currently subtherapeutic 1.56 -Discussed not being on coumadin with patient given his frequent and recurrent falls, he states he understands the risks  History ITP/ Iron deficiency anemia -No labs for comparison -Hemoglobin has been stable 8.3 -Continue to monitor CBC  Recurrent falls -PT and OT consulted, recommended SNF -Patient would like to go home but will consider SNF if he does not have to pay  Consultants None  Procedures  none  Discharge Exam: Vitals:   12/03/16 1218 12/03/16 1512  BP: (!) 154/65 124/73  Pulse: 84   Resp: (!) 23   Temp: 98.1 F (36.7 C)    Denies chest pain, shortness of breath, abdominal pain, nausea or vomiting, diarrhea or constipation.   Exam  General: Well developed, well nourished, NAD  HEENT: NCAT,  mucous membranes moist.   Cardiovascular: S1 S2 auscultated, no rubs, murmurs or gallops. irregular  Respiratory: Clear to auscultation bilaterally with equal chest rise  Abdomen: Soft, nontender, nondistended, + bowel sounds  Extremities: warm dry without cyanosis clubbing or edema. Bruising and swelling of RUE  Skin: Patient has multiple lesions, abrasions, bruises  Neuro:AAOx3, nonfocal  Psych: Appropriate mood and affect  Discharge Instructions Discharge Instructions    Discharge instructions    Complete by:  As directed    Patient will need to follow up with primary care provider within one week of discharge, repeat CBC.  Follow up with coumadin clinic on 12/05/16. Patient should continue medications AS PRESCRIBED.  Patient should follow a heart healthy diet.     Current Discharge Medication List    START taking these medications   Details  Menthol-Methyl Salicylate (MUSCLE RUB) 10-15 % CREA Apply 1 application topically 2 (two) times daily as needed for muscle pain. Refills: 0      CONTINUE  these medications which have NOT CHANGED   Details  acetaminophen (TYLENOL) 325 MG tablet Take 650 mg by mouth every 6 (six)  hours as needed for moderate pain or headache.    atenolol (TENORMIN) 100 MG tablet Take 100 mg by mouth daily.    b complex vitamins tablet Take 1 tablet by mouth daily.    carbidopa-levodopa (SINEMET IR) 25-100 MG tablet Take 1-2 tablets by mouth See admin instructions. Take 2 tablets by mouth in the morning with breakfast, take 1 tablet by mouth with lunch, take 2 tablets by mouth with supper and take 1 tablet by mouth at bedtime    DULoxetine (CYMBALTA) 60 MG capsule Take 60 mg by mouth daily.    finasteride (PROSCAR) 5 MG tablet Take 5 mg by mouth daily.    hydrochlorothiazide (HYDRODIURIL) 25 MG tablet Take 12.5 mg by mouth daily.    methocarbamol (ROBAXIN) 750 MG tablet Take 750 mg by mouth 4 (four) times daily as needed for muscle spasms.    polyethylene glycol (MIRALAX / GLYCOLAX) packet Take 17 g by mouth daily as needed for moderate constipation.    tamsulosin (FLOMAX) 0.4 MG CAPS capsule Take 0.4 mg by mouth daily.    trospium (SANCTURA) 20 MG tablet Take 20 mg by mouth 2 (two) times daily.    warfarin (COUMADIN) 5 MG tablet Take 5 mg by mouth daily.      STOP taking these medications     HYDROcodone-acetaminophen (NORCO/VICODIN) 5-325 MG tablet      traMADol-acetaminophen (ULTRACET) 37.5-325 MG tablet        Allergies  Allergen Reactions  . Statins Other (See Comments)    Unknown   Follow-up Information    WEISER,MARK, MD. Schedule an appointment as soon as possible for a visit in 1 week(s).   Specialty:  Family Medicine Why:  Hospital follow up Contact information: [address] [place] [address] [phone]            The results of significant diagnostics from this hospitalization (including imaging, microbiology, ancillary and laboratory) are listed below for reference.    Significant Diagnostic Studies: Dg Chest 2 View  Result Date: 11/30/2016 CLINICAL DATA:  Altered mental status EXAM: CHEST  2 VIEW COMPARISON:  09/12/2016  FINDINGS: The heart size and mediastinal contours are within normal limits. Both lungs are clear. The visualized skeletal structures are unremarkable. IMPRESSION: No active cardiopulmonary disease. Electronically Signed   By: Elige Ko   On: 11/30/2016 15:31   Ct Head Wo Contrast  Result Date: 11/30/2016 CLINICAL DATA:  Altered mental status today. EXAM: CT HEAD WITHOUT CONTRAST TECHNIQUE: Contiguous axial images were obtained from the base of the skull through the vertex without intravenous contrast. COMPARISON:  Head CT scan 09/10/2016. FINDINGS: Brain: There is no evidence of acute intracranial abnormality including hemorrhage, infarct, mass lesion, mass effect, midline shift or abnormal extra-axial fluid collection. No hydrocephalus or pneumocephalus. Vascular: Mild atherosclerosis is noted. Skull: Intact. Sinuses/Orbits: Mild mucosal thickening is seen in the sphenoid sinuses. Otherwise negative. Other: None. IMPRESSION: No acute abnormality. Electronically Signed   By: Drusilla Kanner M.D.   On: 11/30/2016 16:49   Mr Brain Wo Contrast  Result Date: 11/30/2016 CLINICAL DATA:  Acute encephalopathy. Frequent falls and slurred speech. On Coumadin. History of Parkinson's disease, atrial fibrillation and stroke. EXAM: MRI HEAD WITHOUT CONTRAST TECHNIQUE: Multiplanar, multiecho pulse sequences of the brain and surrounding structures were obtained without intravenous contrast. Due to patient motion, sagittal T1,  coronal T2 not obtained. COMPARISON:  CT HEAD November 30, 2016 at 1640 hours FINDINGS: Moderately motion degraded examination. BRAIN: No reduced diffusion to suggest acute ischemia. No susceptibility artifact to lobar hematoma though with small hemorrhages would not be appreciated due to motion. Minimal white matter changes most compatible with chronic small vessel ischemic disease. The ventricles and sulci are normal for patient's age. No suspicious parenchymal signal, masses or mass effect. No  abnormal extra-axial fluid collections. VASCULAR: Normal major intracranial vascular flow voids present at skull base. SKULL AND UPPER CERVICAL SPINE: No abnormal sellar expansion. No suspicious calvarial bone marrow signal. Craniocervical junction maintained. SINUSES/ORBITS: Small sphenoid sinus air-fluid levels with mild paranasal sinus mucosal thickening. Mastoid air cells are well aerated. The included ocular globes and orbital contents are non-suspicious. OTHER: None. IMPRESSION: Normal noncontrast MRI of the head for age though, limited by moderate motion and incomplete examination. Electronically Signed   By: Awilda Metroourtnay  Bloomer M.D.   On: 11/30/2016 22:54   Dg Humerus Right  Result Date: 11/30/2016 CLINICAL DATA:  Bruising of the right upper arm secondary to a fall. EXAM: RIGHT HUMERUS - 2+ VIEW COMPARISON:  None. FINDINGS: There is no fracture or dislocation. Slight arthritis of the [facility]. Benign bone island in the proximal humerus. IMPRESSION: No acute abnormalities. Electronically Signed   By: Francene BoyersJames  Maxwell M.D.   On: 11/30/2016 15:31   Dg Swallowing Func-speech Pathology  Result Date: 12/02/2016 Objective Swallowing Evaluation: Type of Study: MBS-Modified Barium Swallow Study Patient Details Name: Garrett RavelRobert Stewart MRN: [id number] Date of Birth: [birth year] Today's Date: 12/02/2016 Time: SLP Start Time (ACUTE ONLY): 1002-SLP Stop Time (ACUTE ONLY): 1020 SLP Time Calculation (min) (ACUTE ONLY): 18 min Past Medical History: Past Medical History: Diagnosis Date . A-fib (HCC)  . Chronic ITP (idiopathic thrombocytopenic purpura) (HCC)  . Parkinson's disease (HCC)  . Stroke [facility](HCC)  Past Surgical History: Past Surgical History: Procedure Laterality Date . CHOLECYSTECTOMY   HPI: Garrett SouthwardRobert Bradshawis a 82 y.o.malewith history of Parkinson's disease, CVA, atrial fibrillation, chronic ITP, history of pulmonary embolism was brought to the ER after patient was getting increasingly confused and agitated. Admitted for  acute encephalopathy. CT, MRI of the head was unremarkable. Chest x-ray UA unremarkable. No prior swallowing evaluations in chart. Subjective: Alert, cooperative, pleasant mood Assessment / Plan / Recommendation CHL IP CLINICAL IMPRESSIONS 12/02/2016 Clinical Impression Patient presents with mild pharyngeal dysphagia secondary to bony protrusion ~C4 which intermittently impedes full epiglottic closure leading to mild vallecular residue which clears with liquid wash. No frank aspiration observed. Noted penetration to the level of the vocal cords x1 due to delayed swallow initiation (pyriform sinuses) with consecutive cup sips of thin liquids. Swallow initiation was timely for straw use with thin liquids and small single cup sips, pureed and regular solids. Barium pill was retained in the valleculae with initial swallow of thin liquid, however clears with subsequent liquid wash. Recommend regular diet with thin liquids, medications whole with plenty of liquid or in puree per pt preference. Patient viewed video and was instructed to take small single bites and sips, use a slow rate, and intermittently dry swallow to facilitate clearance of vallecular residue. SLP will f/u for diet tolerance and education/instruction in compensatory techniques to maximize swallowing safety.  SLP Visit Diagnosis Dysphagia, pharyngeal phase (R13.13) Attention and concentration deficit following -- Frontal lobe and executive function deficit following -- Impact on safety and function Mild aspiration risk   CHL IP TREATMENT RECOMMENDATION 12/02/2016 Treatment Recommendations Therapy as outlined in  treatment plan below   Prognosis 12/02/2016 Prognosis for Safe Diet Advancement Good Barriers to Reach Goals -- Barriers/Prognosis Comment -- CHL IP DIET RECOMMENDATION 12/02/2016 SLP Diet Recommendations Regular solids;Thin liquid Liquid Administration via Straw Medication Administration Whole meds with liquid Compensations Slow rate;Small  sips/bites;Other (Comment) Postural Changes Seated upright at 90 degrees   CHL IP OTHER RECOMMENDATIONS 12/02/2016 Recommended Consults -- Oral Care Recommendations Oral care BID Other Recommendations --   CHL IP FOLLOW UP RECOMMENDATIONS 12/02/2016 Follow up Recommendations None   CHL IP FREQUENCY AND DURATION 12/02/2016 Speech Therapy Frequency (ACUTE ONLY) min 1 x/week Treatment Duration 1 week      CHL IP ORAL PHASE 12/02/2016 Oral Phase WFL Oral - Pudding Teaspoon -- Oral - Pudding Cup -- Oral - Honey Teaspoon -- Oral - Honey Cup -- Oral - Nectar Teaspoon -- Oral - Nectar Cup -- Oral - Nectar Straw -- Oral - Thin Teaspoon -- Oral - Thin Cup -- Oral - Thin Straw -- Oral - Puree -- Oral - Mech Soft -- Oral - Regular -- Oral - Multi-Consistency -- Oral - Pill -- Oral Phase - Comment --  CHL IP PHARYNGEAL PHASE 12/02/2016 Pharyngeal Phase Impaired Pharyngeal- Pudding Teaspoon -- Pharyngeal -- Pharyngeal- Pudding Cup -- Pharyngeal -- Pharyngeal- Honey Teaspoon -- Pharyngeal -- Pharyngeal- Honey Cup -- Pharyngeal -- Pharyngeal- Nectar Teaspoon -- Pharyngeal -- Pharyngeal- Nectar Cup -- Pharyngeal -- Pharyngeal- Nectar Straw -- Pharyngeal -- Pharyngeal- Thin Teaspoon Pharyngeal residue - valleculae;Reduced epiglottic inversion Pharyngeal Material does not enter airway Pharyngeal- Thin Cup Penetration/Aspiration during swallow;Delayed swallow initiation-pyriform sinuses;Pharyngeal residue - valleculae;Pharyngeal residue - pyriform;Reduced epiglottic inversion Pharyngeal Material enters airway, CONTACTS cords and not ejected out Pharyngeal- Thin Straw Reduced epiglottic inversion;Pharyngeal residue - valleculae Pharyngeal Material does not enter airway Pharyngeal- Puree Reduced epiglottic inversion;Pharyngeal residue - valleculae Pharyngeal -- Pharyngeal- Mechanical Soft -- Pharyngeal -- Pharyngeal- Regular Reduced epiglottic inversion;Pharyngeal residue - valleculae Pharyngeal -- Pharyngeal- Multi-consistency -- Pharyngeal --  Pharyngeal- Pill Reduced epiglottic inversion;Pharyngeal residue - valleculae Pharyngeal -- Pharyngeal Comment --  CHL IP CERVICAL ESOPHAGEAL PHASE 12/02/2016 Cervical Esophageal Phase WFL Pudding Teaspoon -- Pudding Cup -- Honey Teaspoon -- Honey Cup -- Nectar Teaspoon -- Nectar Cup -- Nectar Straw -- Thin Teaspoon -- Thin Cup -- Thin Straw -- Puree -- Mechanical Soft -- Regular -- Multi-consistency -- Pill -- Cervical Esophageal Comment -- Garrett Stewart, Garrett Stewart Speech-Language Pathologist [phone] No flowsheet data found. Arlana Lindau 12/02/2016, 10:57 AM               Microbiology: No results found for this or any previous visit (from the past 240 hour(s)).   Labs: Basic Metabolic Panel:  Recent Labs Lab 11/30/16 1546 12/01/16 0358 12/02/16 0520  NA 135 134* 133*  K 4.0 3.9 3.9  CL 95* 97* 99*  CO2 30 28 27   GLUCOSE 97 98 129*  BUN 16 14 11   CREATININE 0.83 0.76 0.74  CALCIUM 9.0 8.6* 8.4*   Liver Function Tests:  Recent Labs Lab 11/30/16 1546 12/01/16 0358  AST 21 19  ALT <5* 11*  ALKPHOS 73 71  BILITOT 1.5* 1.8*  PROT 7.3 7.2  ALBUMIN 3.3* 3.1*   No results for input(s): LIPASE, AMYLASE in the last 168 hours.  Recent Labs Lab 11/30/16 2101  AMMONIA 17   CBC:  Recent Labs Lab 11/30/16 1546 12/01/16 0358 12/02/16 0520 12/03/16 0413  WBC 7.9 7.7 7.6 6.7  NEUTROABS 5.0  --   --   --  HGB 10.2* 9.5* 8.9* 8.3*  HCT 31.8* 30.9* 28.7* 26.6*  MCV 93.8 93.6 93.5 92.7  PLT 289 294 320 304   Cardiac Enzymes:  Recent Labs Lab 11/30/16 1546  TROPONINI <0.03   BNP: BNP (last 3 results) No results for input(s): BNP in the last 8760 hours.  ProBNP (last 3 results) No results for input(s): PROBNP in the last 8760 hours.  CBG:  Recent Labs Lab 12/02/16 1134 12/02/16 1803 12/03/16 0000 12/03/16 0609 12/03/16 1126  GLUCAP 107* 115* 120* 99 103*       Signed:  Mykael Trott  [facility] 12/03/2016, 4:09 PM

## 2016-12-03 NOTE — Progress Notes (Addendum)
Pt agreed to SNF. Dr Catha GosselinMikhail made aware and stated for pt to be d/c to snf today.  Clarified with Dr. Catha GosselinMikhail, pt will not need any written prescriptions prior to dc to snf. Pt stated he did not want me to call his children to make them aware that [facility]. IV d/c. Pt's friend [name] at bedside.

## 2016-12-03 NOTE — Clinical Social Work Note (Signed)
Clinical Social Worker facilitated patient discharge including contacting patient family and facility to confirm patient discharge plans.  Clinical information faxed to facility and family agreeable with plan.  CS[facility]  RN to call report [phone] prior to discharge.  Clinical Social Worker will sign off for now as social work intervention is no longer needed. Please consult us again if new need arises.  [place] Droll B. Gean QuintBrown,MSW, LCSWA Clinical Social Work Dept Weekend Social Worker [phone] 3:41 PM

## 2016-12-03 NOTE — Care Management Note (Signed)
Case Management Note  Patient Details  Name: Garrett RavelRobert Stewart MRN: [id number] Date of Birth: 1935/02/05  Subjective/Objective:                 Spoke with patient at the bedside. [facility]. He would like to use [facility] at time of discharge. [facility]    Action/Plan:  DC to home per MD order. HH with [facility].   Expected Discharge Date:  12/03/16               Expected Discharge Plan:  Home w Home Health Services  In-House Referral:  Clinical Social Work  Discharge planning Services  CM Consult  Post Acute Care Choice:  Home Health Choice offered to:  Patient  DME Arranged:    DME Agency:     HH Arranged:  RN, PT, OT, Nurse's Aide HH Agency:  [facility]  Status of Service:  Completed, signed off  If discussed at Long Length of Stay Meetings, dates discussed:    Additional Comments:  Lawerance SabalDebbie Ruqaya Strauss, RN 12/03/2016, 1:44 PM

## 2016-12-03 NOTE — Clinical Social Work Note (Signed)
CS[facility] discussed [facility] with pt and pt's caregiver/male acquaintance @ bedside.  Pt understands that he is out of SNF days. CSW spoke to facility admissions rep Angie who offered pt a rate of 289.00 a day with 7 days of rehab for 14 days. Pt agreeable to this and possible cost to transport him there via [facility]. CSW unclear if pt's insurance will cover this and pt is aware.  Pt has no other needs at this time.  Kynsleigh Westendorf B. Gean QuintBrown,MSW, LCSWA Clinical Social Work Dept Weekend Social Worker [phone] 3:55 PM

## 2016-12-03 NOTE — Progress Notes (Addendum)
Report given to [facility].

## 2016-12-03 NOTE — Progress Notes (Signed)
ANTICOAGULATION CONSULT NOTE - Follow-Up Consult   Pharmacy Consult for warfarin Indication: AFib  Allergies  Allergen Reactions  . Statins Other (See Comments)    Unknown    Patient Measurements: Height: 6' (182.9 cm) Weight: 241 lb 8 oz (109.5 kg) IBW/kg (Calculated) : 77.6 Heparin Dosing Weight: 100 kg  Vital Signs: Temp: 98.1 F (36.7 C) (03/10 1218) Temp Source: Oral (03/10 1218) BP: 154/65 (03/10 1218) Pulse Rate: 84 (03/10 1218)  Labs:  Recent Labs  11/30/16 1546 12/01/16 0358 12/01/16 1059 12/01/16 2119 12/02/16 0520 12/02/16 1215 12/03/16 0413  HGB 10.2* 9.5*  --   --  8.9*  --  8.3*  HCT 31.8* 30.9*  --   --  28.7*  --  26.6*  PLT 289 294  --   --  320  --  304  LABPROT 21.0*  --   --   --   --  18.6* 18.8*  INR 1.79  --   --   --   --  1.54 1.56  HEPARINUNFRC  --   --  0.10* <0.10*  --   --   --   CREATININE 0.83 0.76  --   --  0.74  --   --   TROPONINI <0.03  --   --   --   --   --   --     Estimated Creatinine Clearance: 95.7 mL/min (by C-G formula based on SCr of 0.74 mg/dL).   Medical History: Past Medical History:  Diagnosis Date  . A-fib (HCC)   . Chronic ITP (idiopathic thrombocytopenic purpura) (HCC)   . Parkinson's disease (HCC)   . Stroke [facility](HCC)    Assessment: 82 yo male due to frequent falls and slurred speech, pt is on warfarin PTA for AFib and hx of CVA. Recently admitted at outside hospital due to a fall, though he has a history of frequent falls.  INR drawn on admission was 1.79, hgb trending down, no bleeding noted. AMS improving.INR currently 1.56.   Warfarin 5mg  daily noted to be patient's home dose.  Goal of Therapy:  INR goal 2-3 Monitor platelets by anticoagulation protocol: Yes     Plan:  Warfarin 5 mg tonight Daily INR for now Patient likely needs outpatient INR check in the next few days. Had been holding warfarin due to high INR prior to admission.   Bailey MechEmily Stewart, PharmD PGY1 Pharmacy Resident Pager  [phone] 12/03/2016 3:27 PM

## 2016-12-03 NOTE — Discharge Instructions (Signed)
Fall Prevention in the Home Falls can cause injuries. They can happen to people of all ages. There are many things you can do to make your home safe and to help prevent falls. What can I do on the outside of my home?  Regularly fix the edges of walkways and driveways and fix any cracks.  Remove anything that might make you trip as you walk through a door, such as a raised step or threshold.  Trim any bushes or trees on the path to your home.  Use bright outdoor lighting.  Clear any walking paths of anything that might make someone trip, such as rocks or tools.  Regularly check to see if handrails are loose or broken. Make sure that both sides of any steps have handrails.  Any raised decks and porches should have guardrails on the edges.  Have any leaves, snow, or ice cleared regularly.  Use sand or salt on walking paths during winter.  Clean up any spills in your garage right away. This includes oil or grease spills. What can I do in the bathroom?  Use night lights.  Install grab bars by the toilet and in the tub and shower. Do not use towel bars as grab bars.  Use non-skid mats or decals in the tub or shower.  If you need to sit down in the shower, use a plastic, non-slip stool.  Keep the floor dry. Clean up any water that spills on the floor as soon as it happens.  Remove soap buildup in the tub or shower regularly.  Attach bath mats securely with double-sided non-slip rug tape.  Do not have throw rugs and other things on the floor that can make you trip. What can I do in the bedroom?  Use night lights.  Make sure that you have a light by your bed that is easy to reach.  Do not use any sheets or blankets that are too big for your bed. They should not hang down onto the floor.  Have a firm chair that has side arms. You can use this for support while you get dressed.  Do not have throw rugs and other things on the floor that can make you trip. What can I do in the  kitchen?  Clean up any spills right away.  Avoid walking on wet floors.  Keep items that you use a lot in easy-to-reach places.  If you need to reach something above you, use a strong step stool that has a grab bar.  Keep electrical cords out of the way.  Do not use floor polish or wax that makes floors slippery. If you must use wax, use non-skid floor wax.  Do not have throw rugs and other things on the floor that can make you trip. What can I do with my stairs?  Do not leave any items on the stairs.  Make sure that there are handrails on both sides of the stairs and use them. Fix handrails that are broken or loose. Make sure that handrails are as long as the stairways.  Check any carpeting to make sure that it is firmly attached to the stairs. Fix any carpet that is loose or worn.  Avoid having throw rugs at the top or bottom of the stairs. If you do have throw rugs, attach them to the floor with carpet tape.  Make sure that you have a light switch at the top of the stairs and the bottom of the stairs. If you do   not have them, ask someone to add them for you. What else can I do to help prevent falls?  Wear shoes that:  Do not have high heels.  Have rubber bottoms.  Are comfortable and fit you well.  Are closed at the toe. Do not wear sandals.  If you use a stepladder:  Make sure that it is fully opened. Do not climb a closed stepladder.  Make sure that both sides of the stepladder are locked into place.  Ask someone to hold it for you, if possible.  Clearly mark and make sure that you can see:  Any grab bars or handrails.  First and last steps.  Where the edge of each step is.  Use tools that help you move around (mobility aids) if they are needed. These include:  Canes.  Walkers.  Scooters.  Crutches.  Turn on the lights when you go into a dark area. Replace any light bulbs as soon as they burn out.  Set up your furniture so you have a clear path.  Avoid moving your furniture around.  If any of your floors are uneven, fix them.  If there are any pets around you, be aware of where they are.  Review your medicines with your doctor. Some medicines can make you feel dizzy. This can increase your chance of falling. Ask your doctor what other things that you can do to help prevent falls. This information is not intended to replace advice given to you by your health care provider. Make sure you discuss any questions you have with your health care provider. Document Released: 07/09/2009 Document Revised: 02/18/2016 Document Reviewed: 10/17/2014 Elsevier Interactive Patient Education  2017 [employer]  

## 2016-12-03 NOTE — Clinical Social Work Placement (Signed)
   CLINICAL SOCIAL WORK PLACEMENT  NOTE  Date:  12/03/2016  Patient Details  Name: Garrett RavelRobert Neils MRN: [id number] Date of Birth: [birth year]  Clinical Social Work is seeking post-discharge placement for this patient at the Skilled  Nursing Facility level of care (*CSW will initial, date and re-position this form in  chart as items are completed):  Yes   Patient/family provided with [facility] Clinical Social Work Department's list of facilities offering this level of care within the geographic area requested by the patient (or if unable, by the patient's family).  Yes   Patient/family informed of their freedom to choose among providers that offer the needed level of care, that participate in Medicare, Medicaid or managed care program needed by the patient, have an available bed and are willing to accept the patient.  Yes   Patient/family informed of [facility]'s ownership interest [facility] and [facility], as well as of the fact that they are under no obligation to receive care at these facilities.  PASRR submitted to EDS on       PASRR number received on       Existing PASRR number confirmed on 12/03/16     FL2 transmitted to all facilities in geographic area requested by pt/family on 12/01/16     FL2 transmitted to all facilities within larger geographic area on       Patient informed that his/her managed care company has contracts with or will negotiate with certain facilities, including the following:        Yes   Patient/family informed of bed offers received.  Patient chooses bed at  [facility])     Physician recommends and patient chooses bed at      Patient to be transferred to  [facility]) on 12/03/16.  Patient to be transferred to facility by  [facility])     Patient family notified on 12/03/16 of transfer.  Name of family member notified:  Son and Girlfriend     PHYSICIAN Please sign FL2     Additional Comment:     _______________________________________________ Norlene DuelBROWN, Jaidee Stipe B, LCSWA 12/03/2016, 3:49 PM

## 2016-12-03 NOTE — Progress Notes (Signed)
Attempted to call report

## 2017-02-21 ENCOUNTER — Encounter: Payer: Self-pay | Admitting: Neurology

## 2017-03-23 ENCOUNTER — Emergency Department (HOSPITAL_COMMUNITY): Payer: Medicare Other

## 2017-03-23 ENCOUNTER — Emergency Department (HOSPITAL_COMMUNITY)
Admission: EM | Admit: 2017-03-23 | Discharge: 2017-03-23 | Disposition: A | Discharge status: Home / Self Care | Payer: Medicare Other | Attending: Emergency Medicine | Admitting: Emergency Medicine

## 2017-03-23 ENCOUNTER — Encounter (HOSPITAL_COMMUNITY): Payer: Self-pay

## 2017-03-23 DIAGNOSIS — G20 Parkinson's disease: Secondary | ICD-10-CM | POA: Insufficient documentation

## 2017-03-23 DIAGNOSIS — Z7901 Long term (current) use of anticoagulants: Secondary | ICD-10-CM | POA: Insufficient documentation

## 2017-03-23 DIAGNOSIS — R531 Weakness: Secondary | ICD-10-CM | POA: Diagnosis present

## 2017-03-23 DIAGNOSIS — Z8673 Personal history of transient ischemic attack (TIA), and cerebral infarction without residual deficits: Secondary | ICD-10-CM | POA: Diagnosis not present

## 2017-03-23 DIAGNOSIS — Z79899 Other long term (current) drug therapy: Secondary | ICD-10-CM | POA: Diagnosis not present

## 2017-03-23 DIAGNOSIS — I10 Essential (primary) hypertension: Secondary | ICD-10-CM | POA: Insufficient documentation

## 2017-03-23 DIAGNOSIS — R5383 Other fatigue: Secondary | ICD-10-CM | POA: Diagnosis not present

## 2017-03-23 LAB — BASIC METABOLIC PANEL
ANION GAP: 9 (ref 5–15)
BUN: 12 mg/dL (ref 6–20)
CHLORIDE: 99 mmol/L — AB (ref 101–111)
CO2: 28 mmol/L (ref 22–32)
Calcium: 9.2 mg/dL (ref 8.9–10.3)
Creatinine, Ser: 0.83 mg/dL (ref 0.61–1.24)
GFR calc non Af Amer: >60 mL/min (ref >60)
GLUCOSE: 105 mg/dL — AB (ref 65–99)
Potassium: 4.6 mmol/L (ref 3.5–5.1)
Sodium: 136 mmol/L (ref 135–145)

## 2017-03-23 LAB — CBC
HEMATOCRIT: 40.9 % (ref 39.0–52.0)
HEMOGLOBIN: 12.7 g/dL — AB (ref 13.0–17.0)
MCH: 28.3 pg (ref 26.0–34.0)
MCHC: 31.1 g/dL (ref 30.0–36.0)
MCV: 91.3 fL (ref 78.0–100.0)
Platelets: 231 10*3/uL (ref 150–400)
RBC: 4.48 MIL/uL (ref 4.22–5.81)
RDW: 16.9 % — ABNORMAL HIGH (ref 11.5–15.5)
WBC: 5.3 10*3/uL (ref 4.0–10.5)

## 2017-03-23 LAB — URINALYSIS, ROUTINE W REFLEX MICROSCOPIC
Bilirubin Urine: NEGATIVE
Glucose, UA: NEGATIVE mg/dL
Hgb urine dipstick: NEGATIVE
Ketones, ur: NEGATIVE mg/dL
Leukocytes, UA: NEGATIVE
Nitrite: NEGATIVE
Protein, ur: NEGATIVE mg/dL
Specific Gravity, Urine: 1.015 (ref 1.005–1.030)
pH: 5 (ref 5.0–8.0)

## 2017-03-23 LAB — I-STAT CG4 LACTIC ACID, ED: Lactic Acid, Venous: 1.92 mmol/L (ref 0.5–1.9)

## 2017-03-23 LAB — I-STAT TROPONIN, ED: Troponin i, poc: 0.02 ng/mL (ref 0.00–0.08)

## 2017-03-23 MED ORDER — SODIUM CHLORIDE 0.9 % IV BOLUS (SEPSIS)
500.0000 mL | Freq: Once | INTRAVENOUS | Status: AC
  Administered 2017-03-23: 500 mL via INTRAVENOUS

## 2017-03-23 MED ORDER — LORAZEPAM 0.5 MG PO TABS: 0.5000 mg | ORAL_TABLET | ORAL | 0 refills | Status: AC | PRN

## 2017-03-23 NOTE — ED Notes (Signed)
Patient transported to CT 

## 2017-03-23 NOTE — ED Provider Notes (Signed)
[facility] vague [facility].  He  has a past medical history of A-fib (HCC); Chronic ITP (idiopathic thrombocytopenic purpura) (HCC); Parkinson's disease (HCC); and Stroke (HCC). He has been has a pending CXR. Likely dehydrated given his slightly elevated lactic acid level.    Patient CXR negative. He is feeling improved after fluids. Patient appears safe for d/c with close pcp f/u. I have discussed lab and/or imaging findings with the patient and answered all questions/concerns to the best of my ability.    Arthor CaptainHarris, Damarys Speir, PA-C 03/25/17 1357    Lavera GuiseLiu, Dana Duo, MD 03/28/17 1357

## 2017-03-23 NOTE — ED Notes (Signed)
Patient transported to X-ray 

## 2017-03-23 NOTE — ED Provider Notes (Signed)
[facility]-EMERGENCY DEPT Provider Note   CSN: [id number] Arrival date & time: 03/23/17  1156     History   Chief Complaint Chief Complaint  Patient presents with  . Weakness  . Headache    HPI Garrett Stewart is a 82 y.o. male.  HPI Patient presents to the emergency department with not feeling well this morning.  The patient states that this morning he was not feeling well and just felt generally tired.  The patient states that he has been feeling better this afternoon.  Patient states that nothing seems make the condition better or worse.  He states that he has been feeling tired and fatigued for quite a while but seemed to be worse this morning.  The patient's girlfriend states that these issues seemed to not be new he has been having more trouble swallowing over the last few weeks as well.  She also states that he has been having more issues with worsening anxiety. The patient denies chest pain, shortness of breath, headache,blurred vision, neck pain, fever, cough,  numbness, dizziness, anorexia, edema, abdominal pain, nausea, vomiting, diarrhea, rash, back pain, dysuria, hematemesis, bloody stool, near syncope, or syncope. Past Medical History:  Diagnosis Date  . A-fib (HCC)   . Chronic ITP (idiopathic thrombocytopenic purpura) (HCC)   . Parkinson's disease (HCC)   . Stroke [facility])     Patient Active Problem List   Diagnosis Date Noted  . Acute encephalopathy 11/30/2016  . Chronic atrial fibrillation (HCC) 11/30/2016  . Chronic ITP (idiopathic thrombocytopenia) (HCC) 11/30/2016  . Hypertension 11/30/2016  . Parkinson's disease (HCC) 11/30/2016  . Normocytic normochromic anemia 11/30/2016  . History of pulmonary embolism 11/30/2016    Past Surgical History:  Procedure Laterality Date  . CHOLECYSTECTOMY         Home Medications    Prior to Admission medications   Medication Sig Start Date End Date Taking? Authorizing Provider  atenolol (TENORMIN) 100 MG tablet Take 100  mg by mouth daily.   Yes [provider]  b complex vitamins tablet Take 1 tablet by mouth daily.   Yes [provider]  busPIRone (BUSPAR) 10 MG tablet Take 10 mg by mouth 3 (three) times daily. 02/23/17  Yes [provider]  carbidopa-levodopa (SINEMET IR) 25-100 MG tablet Take 1-2 tablets by mouth See admin instructions. Take 2 tablets by mouth in the morning with breakfast, take 1 tablet by mouth with lunch, take 2 tablets by mouth with supper and take 1 tablet by mouth at bedtime 10/25/16  Yes [provider]  DULoxetine (CYMBALTA) 30 MG capsule Take 30 mg by mouth daily.    Yes [provider]  finasteride (PROSCAR) 5 MG tablet Take 5 mg by mouth daily.   Yes [provider]  hydrochlorothiazide (HYDRODIURIL) 25 MG tablet Take 12.5 mg by mouth daily.   Yes [provider]  methocarbamol (ROBAXIN) 750 MG tablet Take 750 mg by mouth 4 (four) times daily as needed for muscle spasms.   Yes [provider]  polyethylene glycol (MIRALAX / GLYCOLAX) packet Take 17 g by mouth daily as needed for moderate constipation.   Yes [provider]  tamsulosin (FLOMAX) 0.4 MG CAPS capsule Take 0.4 mg by mouth daily.   Yes [provider]  trospium (SANCTURA) 20 MG tablet Take 20 mg by mouth 2 (two) times daily. 06/24/15  Yes [provider]  warfarin (COUMADIN) 5 MG tablet Take 5 mg by mouth daily.   Yes [provider]  acetaminophen (TYLENOL) 325 MG tablet Take 650 mg by mouth every 6 (six) hours as needed for moderate pain or headache.    [provider]  Menthol-Methyl Salicylate (MUSCLE RUB) 10-15 % CREA Apply 1 application topically 2 (two) times daily as needed for muscle pain. Patient not taking: Reported on 03/23/2017 12/03/16   Edsel Petrin, DO    Family History Family History  Problem Relation Age of Onset  . Family history unknown: Yes    Social History Social History    Substance Use Topics  . Smoking status: Never Smoker  . Smokeless tobacco: Never Used  . Alcohol use No     Allergies   Statins   Review of Systems Review of Systems All other systems negative except as documented in the HPI. All pertinent positives and negatives as reviewed in the HPI.  Physical Exam Updated Vital Signs BP 107/86   Pulse 62   Temp 98.2 F (36.8 C) (Oral)   Resp 17   SpO2 95%   Physical Exam  Constitutional: He is oriented to person, place, and time. He appears well-developed and well-nourished. No distress.  HENT:  Head: Normocephalic and atraumatic.  Mouth/Throat: Oropharynx is clear and moist.  Eyes: Pupils are equal, round, and reactive to light.  Neck: Normal range of motion. Neck supple.  Cardiovascular: Normal rate, regular rhythm and normal heart sounds.  Exam reveals no gallop and no friction rub.   No murmur heard. Pulmonary/Chest: Effort normal and breath sounds normal. No respiratory distress. He has no wheezes.  Abdominal: Soft. Bowel sounds are normal. He exhibits no distension. There is no tenderness.  Neurological: He is alert and oriented to person, place, and time. He exhibits normal muscle tone. Coordination normal.  Skin: Skin is warm and dry. Capillary refill takes less than 2 seconds. No rash noted. No erythema.  Psychiatric: He has a normal mood and affect. His behavior is normal.  Nursing note and vitals reviewed.    ED Treatments / Results  Labs (all labs ordered are listed, but only abnormal results are displayed) Labs Reviewed  BASIC METABOLIC PANEL - Abnormal; Notable for the following:       Result Value   Chloride 99 (*)    Glucose, Bld 105 (*)    All other components within normal limits  CBC - Abnormal; Notable for the following:    Hemoglobin 12.7 (*)    RDW 16.9 (*)    All other components within normal limits  I-STAT CG4 LACTIC ACID, ED - Abnormal; Notable for the following:    Lactic Acid, Venous 1.92 (*)     All other components within normal limits  URINALYSIS, ROUTINE W REFLEX MICROSCOPIC  CBG MONITORING, ED  I-STAT TROPOININ, ED    EKG  EKG Interpretation None       Radiology Ct Head Wo Contrast  Result Date: 03/23/2017 CLINICAL DATA:  82 year old male with headache and generalize weakness since this morning. EXAM: CT HEAD WITHOUT CONTRAST TECHNIQUE: Contiguous axial images were obtained from the base of the skull through the vertex without intravenous contrast. COMPARISON:  Brain MRI 11/30/2016, head CT 11/30/2016 and earlier. FINDINGS: Brain: Stable cerebral volume. No midline shift, ventriculomegaly, mass effect, evidence of mass lesion, intracranial hemorrhage or evidence of cortically based acute infarction. Gray-white matter differentiation appears stable and normal for age. No cortical encephalomalacia identified. Vascular: Calcified atherosclerosis at the skull base. No suspicious intracranial vascular hyperdensity. Skull: Stable.  No acute osseous abnormality identified. Sinuses/Orbits: Visualized paranasal sinuses  and mastoids are stable and well pneumatized. Other: Visualized orbits and scalp soft tissues are within normal limits. IMPRESSION: Stable and normal for age noncontrast CT appearance of the brain. Electronically Signed   By: Odessa FlemingH  Hall M.D.   On: 03/23/2017 13:01    Procedures Procedures (including critical care time)  Medications Ordered in ED Medications  sodium chloride 0.9 % bolus 500 mL (500 mLs Intravenous New Bag/Given 03/23/17 1336)     Initial Impression / Assessment and Plan / ED Course  I have reviewed the triage vital signs and the nursing notes.  Pertinent labs & imaging results that were available during my care of the patient were reviewed by me and considered in my medical decision making (see chart for details).     Loistine Chancehilip the patient is stable here in the emergency department does not have any focal deficits.  The issues that are brought up by  the patient and the girlfriend seem to be chronic in nature and will need further evaluation.  The patient will get a chest x-ray to ensure that there is no aspiration type pneumonia  based on the fact that he had had trouble swallowing.   Final Clinical Impressions(s) / ED Diagnoses   Final diagnoses:  None    New Prescriptions New Prescriptions   No medications on file     Charlestine NightLawyer, Avian Greenawalt, Cordelia Poche-C 03/23/17 1640    Tegeler, Canary Brimhristopher J, MD 03/23/17 1721

## 2017-03-23 NOTE — ED Triage Notes (Signed)
[facility] and weakness that started this morning. Pt is not weak on one side but over whole body.

## 2017-03-23 NOTE — ED Notes (Signed)
Pt returned to room from CT

## 2017-03-23 NOTE — Discharge Instructions (Addendum)
Return here as needed.  Follow-up with your primary care doctor  Get help right away if: You feel confused. Your vision is blurry. You feel faint or pass out. You have a severe headache. You have severe abdominal, pelvic, or back pain. You have chest pain, shortness of breath, or an irregular or fast heartbeat. You are unable to urinate or you urinate less than normal. You develop abnormal bleeding, such as bleeding from the rectum, vagina, nose, lungs, or nipples. You vomit blood. You have thoughts about harming yourself or committing suicide. You are worried that you might harm someone else.

## 2017-03-30 ENCOUNTER — Ambulatory Visit: Payer: Medicare Other | Admitting: Neurology

## 2017-04-10 NOTE — Progress Notes (Addendum)
Garrett Stewart was seen today in the movement disorders clinic for neurologic consultation at the request of Dr. Stacy Gardner.  His PCP is  Paulino Door, MD.  Pt accompanied by girlfriend who supplements the history.  The consultation is for the evaluation of PD.  Last seen by Dr. Anne Hahn in 01/2017.  Pt has a complicated hx.  The records that were made available to me were reviewed.  The patient has a history of spontaneous intracranial hemorrhage in 2013 with platelet count at the time of 1000.  He was diagnosed with ITP.  He also has a history of paroxysmal A. fib, but because of EtOH abuse and fluctuating INR, he was initially not anticoagulated.  He had a PE in 2014 and was put back on coumadin.  Records indicate that right arm tremor began in approximately 2012-2013.  Records are not clear but he has been on carbidopa/levodopa at least since 2015.  It appears that he was given Neupro to try in 2015, but never actually took it after reading about potential side effects.  In 2016, he was given a prescription for Azilect, but did not initally take that because of cost but later did and thought that it caused swelling and it was d/c.  He was started on ropinirole in 2016, but stopped it not long after he took it because he thought it caused leg cramping.  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  He is currently on carbidopa/levodopa 25/100, and he is supposed to be on  2/1/2/1 but he is actually on 2 po tid. He thinks that it may help the tremor.  He is supposed to be on requip per Dr. Anne Hahn' notes but is notes. When he last saw Dr. Anne Hahn in 01/2017, she reported that pt had d/c EtOH and that he was angry and agitated.  Specific Symptoms:  Tremor: Yes.  , R arm/hand and rarely in the L finger Family hx of similar:  No. Voice: fluctuates per patient; more difficult to understand per girlfriend Sleep: trouble getting and staying asleep  Vivid Dreams:  No.  Acting out dreams:  No. Wet  Pillows: No. Postural symptoms:  Yes.    Falls?  Yes.  , used to be every other day but less frequently now that living with girlfriend and she is insisting he uses walker Bradykinesia symptoms: difficulty getting out of a chair, difficulty regaining balance and freezing and festinating Loss of smell:  No. Loss of taste:  No. Urinary Incontinence:  Yes.  , wearing undergarments - told due to BPH and supposed to have TURP on Friday Difficulty Swallowing:  Yes.  , sounds like had MBE about a year ago (I could not find that ) Handwriting, micrographia: Yes.   Trouble with ADL's:  Yes.    Trouble buttoning clothing: Yes.   Depression:  More angry/irritable Memory changes:  No. per patient; girlfriend says it is off Hallucinations:  No.  visual distortions: No. N/V:  No. Lightheaded:  Yes.  , rarely  Syncope: No. Diplopia:  No. Dyskinesia:  No.   PREVIOUS MEDICATIONS: Sinemet and Requip Neupro (was given but never actually took after he read about potential side effects); azilect (thought that it caused LE edema)  ALLERGIES:   Allergies  Allergen Reactions  . Statins Other (See Comments)    Unknown    CURRENT MEDICATIONS:  Outpatient Encounter Prescriptions as of 04/11/2017  Medication Sig  . acetaminophen (TYLENOL) 325 MG tablet Take 650 mg  by mouth every 6 (six) hours as needed for moderate pain or headache.  Marland Kitchen atenolol (TENORMIN) 100 MG tablet Take 100 mg by mouth daily.  Marland Kitchen b complex vitamins tablet Take 1 tablet by mouth daily.  . busPIRone (BUSPAR) 10 MG tablet Take 10 mg by mouth 3 (three) times daily.  . carbidopa-levodopa (SINEMET IR) 25-100 MG tablet Take 2 tablets by mouth 3 (three) times daily.   . DULoxetine (CYMBALTA) 30 MG capsule Take 90 mg by mouth daily.   . finasteride (PROSCAR) 5 MG tablet Take 5 mg by mouth daily.  . hydrochlorothiazide (HYDRODIURIL) 25 MG tablet Take 12.5 mg by mouth daily.  Marland Kitchen LORazepam (ATIVAN) 0.5 MG tablet Take 1 tablet (0.5 mg total) by  mouth as needed for anxiety.  . Menthol-Methyl Salicylate (MUSCLE RUB) 10-15 % CREA Apply 1 application topically 2 (two) times daily as needed for muscle pain.  . methocarbamol (ROBAXIN) 750 MG tablet Take 750 mg by mouth 4 (four) times daily as needed for muscle spasms.  . polyethylene glycol (MIRALAX / GLYCOLAX) packet Take 17 g by mouth daily as needed for moderate constipation.  . tamsulosin (FLOMAX) 0.4 MG CAPS capsule Take 0.4 mg by mouth daily.  . trospium (SANCTURA) 20 MG tablet Take 20 mg by mouth 2 (two) times daily.  Marland Kitchen amLODipine (NORVASC) 5 MG tablet Take 5 mg by mouth daily.  . furosemide (LASIX) 40 MG tablet Take 40 mg by mouth as needed.  . traMADol-acetaminophen (ULTRACET) 37.5-325 MG tablet Take 1 tablet by mouth as needed.  . warfarin (COUMADIN) 5 MG tablet Take 5 mg by mouth daily.   No facility-administered encounter medications on file as of 04/11/2017.     PAST MEDICAL HISTORY:   Past Medical History:  Diagnosis Date  . A-fib (HCC)   . BPH (benign prostatic hyperplasia)   . Chronic ITP (idiopathic thrombocytopenic purpura) (HCC)   . Parkinson's disease (HCC)   . Pulmonary embolism (HCC)   . Stroke [facility])     PAST SURGICAL HISTORY:   Past Surgical History:  Procedure Laterality Date  . CHOLECYSTECTOMY      SOCIAL HISTORY:   Social History   Social History  . Marital status: Single    Spouse name: N/A  . Number of children: N/A  . Years of education: N/A   Occupational History  . Not on file.   Social History Main Topics  . Smoking status: Never Smoker  . Smokeless tobacco: Current User    Types: Chew  . Alcohol use Yes     Comment: very rare; hx of EtOH abuse  . Drug use: No  . Sexual activity: Not on file   Other Topics Concern  . Not on file   Social History Narrative  . No narrative on file    FAMILY HISTORY:   Family Status  Relation Status  . Mother Deceased  . Father Deceased  . Son Deceased  . Child Alive    ROS:  Fatigue.   Admits to SOB.  A complete 10 system review of systems was obtained and was unremarkable apart from what is mentioned above.  PHYSICAL EXAMINATION:    VITALS:   Vitals:   04/11/17 1316  BP: 110/70  Pulse: 78  SpO2: 92%  Weight: 242 lb (109.8 kg)  Height: 6' (1.829 m)    GEN:  The patient appears stated age and is in NAD. HEENT:  Normocephalic, atraumatic.  The mucous membranes are moist. The superficial temporal arteries are  without ropiness or tenderness. CV:  Irreg irreg Lungs:  CTAB.  Some DOE Neck/HEME:  There are no carotid bruits bilaterally.  Neurological examination:  Orientation:  Montreal Cognitive Assessment  04/11/2017  Visuospatial/ Executive (0/5) 2  Naming (0/3) 3  Attention: Read list of digits (0/2) 2  Attention: Read list of letters (0/1) 1  Attention: Serial 7 subtraction starting at 100 (0/3) 1  Language: Repeat phrase (0/2) 2  Language : Fluency (0/1) 0  Abstraction (0/2) 2  Delayed Recall (0/5) 1  Orientation (0/6) 6  Total 20  Adjusted Score (based on education) 20   Cranial nerves: There is good facial symmetry. Pupils are equal round and reactive to light bilaterally. Fundoscopic exam reveals clear margins bilaterally. Extraocular muscles are intact. The visual fields are full to confrontational testing. The speech is fluent and clear.   He has pseudobulbar speech.  Soft palate rises symmetrically and there is no tongue deviation. Hearing is intact to conversational tone. Sensation: Sensation is intact to light and pinprick throughout (facial, trunk, extremities). Vibration is intact at the bilateral big toe but it is decreased. There is no extinction with double simultaneous stimulation. There is no sensory dermatomal level identified. Motor: Strength is 5/5 in the bilateral upper and L lower extremities.  Strength is 4/5 in the proximal RLE (states that way ever since stroke).  R foot is externally rotated (started after a fall but reports is getting  worse).   Shoulder shrug is equal and symmetric.  There is no pronator drift. Deep tendon reflexes: Deep tendon reflexes are 2-/4 at the bilateral biceps, triceps, brachioradialis, patella and achilles. Plantar responses are downgoing bilaterally.  Movement examination: Tone: There is normal tone in the bilateral upper extremities.  The tone in the lower extremities is normal.  Abnormal movements: none Coordination:  There is decremation with RAM's, seen mostly with heel and toe taps bilaterally Gait and Station: The patient has difficulty arising out of a deep-seated chair without the use of the hands. The patient ambulates with a Rollator.  He flexes at the waist and pushes the walker very quickly.  He is unstable.  He does not shuffle.  He is not short stepped.  He does festinate.  When the walker is taken away, he is much more unstable.  Part of this is orthopedic due to the external rotation of the right ankle from ankle injury.  ASSESSMENT/PLAN:  1.  Parkinsonism, by history.  -The patient was on levodopa today, which is likely covering up some of his symptoms.  He reports that he does have right hand tremor when he is off of medication.  I did not see this today.  I am concerned, however, that he may not have idiopathic Parkinson's disease.  I am concerned about one of the atypical states.  He certainly has a pseudobulbar speech pattern.  He also has a significant number of falls as well as swallowing difficulty.  This certainly can come with Parkinson's disease, but the pseudobulbar speech is a bit rare.  I would like to see him off of levodopa so he will hold next visit before I see him.  Until then, he can continue carbidopa/levodopa 25/100 2 po tid (has had some compliance issues)  -needs to continue to use walker at all times.  -will reorder MBE if I cannot find his old one.  I looked for it for quite sometime within care everywhere and I could not find it.  I will continue to try  and look.   (Addendum:  Found MBE.  He had said was done at [facility] but was done within our own system and hadn't even looked there.  Done in 11/2016.  Had mild pharyngeal dysphagia secondary to bony protrusion ~C4 which intermittently impedes full epiglottic closure leading to mild vallecular residue which clears with liquid wash. No frank aspiration observed. Regular diet and thin liquid okay)  -I am going to order a DaT scan.  I discussed with him that this really does not help [place] between Parkinson's disease and the atypical states, but he was not all that parkinsonian today.  As above, however, it may just be that the levodopa is covering up some of the symptoms.  He was agreeable and we will go ahead and ordered the DaT scan.  2.  I will see him back after the above has been completed.  On a side note, I did have a discussion with the patient about the importance of respect in our office.  Medical records indicate that he has not always been respectful to his medical providers and he was mildly agitated when first entering the office (relating to doing MoCA).  He was pleasant with me today and expressed understanding.  Much greater than 50% of this visit was spent in counseling and coordinating care.  Total face to face time:  60 min, which did not include the 35 min of record review prior to the visit which was non face to face time   Cc:  Paulino Door, MD

## 2017-04-11 ENCOUNTER — Encounter (HOSPITAL_COMMUNITY): Payer: Self-pay

## 2017-04-11 ENCOUNTER — Emergency Department (HOSPITAL_COMMUNITY): Payer: Medicare Other

## 2017-04-11 ENCOUNTER — Inpatient Hospital Stay (HOSPITAL_COMMUNITY)
Admission: EM | Admit: 2017-04-11 | Discharge: 2017-04-16 | DRG: 292 | Disposition: A | Discharge status: Home / Self Care | Payer: Medicare Other | Attending: Internal Medicine | Admitting: Internal Medicine

## 2017-04-11 ENCOUNTER — Ambulatory Visit (INDEPENDENT_AMBULATORY_CARE_PROVIDER_SITE_OTHER): Payer: Medicare Other | Admitting: Neurology

## 2017-04-11 ENCOUNTER — Encounter: Payer: Self-pay | Admitting: Neurology

## 2017-04-11 VITALS — BP 110/70 | HR 78 | Ht 72.0 in | Wt 242.0 lb

## 2017-04-11 DIAGNOSIS — N400 Enlarged prostate without lower urinary tract symptoms: Secondary | ICD-10-CM | POA: Diagnosis present

## 2017-04-11 DIAGNOSIS — R251 Tremor, unspecified: Secondary | ICD-10-CM | POA: Diagnosis not present

## 2017-04-11 DIAGNOSIS — G20 Parkinson's disease: Secondary | ICD-10-CM | POA: Diagnosis not present

## 2017-04-11 DIAGNOSIS — I509 Heart failure, unspecified: Secondary | ICD-10-CM

## 2017-04-11 DIAGNOSIS — Z86711 Personal history of pulmonary embolism: Secondary | ICD-10-CM

## 2017-04-11 DIAGNOSIS — D649 Anemia, unspecified: Secondary | ICD-10-CM | POA: Diagnosis not present

## 2017-04-11 DIAGNOSIS — I482 Chronic atrial fibrillation, unspecified: Secondary | ICD-10-CM | POA: Diagnosis present

## 2017-04-11 DIAGNOSIS — D693 Immune thrombocytopenic purpura: Secondary | ICD-10-CM | POA: Diagnosis present

## 2017-04-11 DIAGNOSIS — I110 Hypertensive heart disease with heart failure: Secondary | ICD-10-CM | POA: Diagnosis not present

## 2017-04-11 DIAGNOSIS — R490 Dysphonia: Secondary | ICD-10-CM | POA: Diagnosis not present

## 2017-04-11 DIAGNOSIS — I5033 Acute on chronic diastolic (congestive) heart failure: Secondary | ICD-10-CM | POA: Diagnosis not present

## 2017-04-11 DIAGNOSIS — I10 Essential (primary) hypertension: Secondary | ICD-10-CM | POA: Diagnosis not present

## 2017-04-11 DIAGNOSIS — Z79899 Other long term (current) drug therapy: Secondary | ICD-10-CM

## 2017-04-11 DIAGNOSIS — J383 Other diseases of vocal cords: Secondary | ICD-10-CM

## 2017-04-11 DIAGNOSIS — F329 Major depressive disorder, single episode, unspecified: Secondary | ICD-10-CM | POA: Diagnosis present

## 2017-04-11 DIAGNOSIS — Z9114 Patient's other noncompliance with medication regimen: Secondary | ICD-10-CM

## 2017-04-11 DIAGNOSIS — R1319 Other dysphagia: Secondary | ICD-10-CM

## 2017-04-11 DIAGNOSIS — R0902 Hypoxemia: Secondary | ICD-10-CM

## 2017-04-11 DIAGNOSIS — I5043 Acute on chronic combined systolic (congestive) and diastolic (congestive) heart failure: Secondary | ICD-10-CM

## 2017-04-11 DIAGNOSIS — I081 Rheumatic disorders of both mitral and tricuspid valves: Secondary | ICD-10-CM | POA: Diagnosis present

## 2017-04-11 DIAGNOSIS — E871 Hypo-osmolality and hyponatremia: Secondary | ICD-10-CM | POA: Diagnosis present

## 2017-04-11 DIAGNOSIS — Z7901 Long term (current) use of anticoagulants: Secondary | ICD-10-CM

## 2017-04-11 DIAGNOSIS — F419 Anxiety disorder, unspecified: Secondary | ICD-10-CM | POA: Diagnosis present

## 2017-04-11 HISTORY — DX: Pure hypercholesterolemia, unspecified: E78.00

## 2017-04-11 HISTORY — DX: Essential (primary) hypertension: I10

## 2017-04-11 HISTORY — DX: Pneumonia, unspecified organism: J18.9

## 2017-04-11 HISTORY — DX: Depression, unspecified: F32.A

## 2017-04-11 HISTORY — DX: Headache, unspecified: R51.9

## 2017-04-11 HISTORY — DX: Low back pain, unspecified: M54.50

## 2017-04-11 HISTORY — DX: Anxiety disorder, unspecified: F41.9

## 2017-04-11 HISTORY — DX: Acute on chronic combined systolic (congestive) and diastolic (congestive) heart failure: I50.43

## 2017-04-11 HISTORY — DX: Personal history of other diseases of the musculoskeletal system and connective tissue: Z87.39

## 2017-04-11 HISTORY — DX: Low back pain: M54.5

## 2017-04-11 HISTORY — DX: Headache: R51

## 2017-04-11 HISTORY — DX: Unspecified osteoarthritis, unspecified site: M19.90

## 2017-04-11 HISTORY — DX: Other chronic pain: G89.29

## 2017-04-11 HISTORY — DX: Major depressive disorder, single episode, unspecified: F32.9

## 2017-04-11 LAB — CBC WITH DIFFERENTIAL/PLATELET
Basophils Absolute: 0 10*3/uL (ref 0.0–0.1)
Basophils Relative: 0 %
EOS ABS: 0.1 10*3/uL (ref 0.0–0.7)
Eosinophils Relative: 2 %
HEMATOCRIT: 34.5 % — AB (ref 39.0–52.0)
HEMOGLOBIN: 11.1 g/dL — AB (ref 13.0–17.0)
LYMPHS ABS: 1.1 10*3/uL (ref 0.7–4.0)
LYMPHS PCT: 16 %
MCH: 29.1 pg (ref 26.0–34.0)
MCHC: 32.2 g/dL (ref 30.0–36.0)
MCV: 90.3 fL (ref 78.0–100.0)
MONOS PCT: 8 %
Monocytes Absolute: 0.6 10*3/uL (ref 0.1–1.0)
NEUTROS ABS: 5 10*3/uL (ref 1.7–7.7)
NEUTROS PCT: 74 %
Platelets: 226 10*3/uL (ref 150–400)
RBC: 3.82 MIL/uL — AB (ref 4.22–5.81)
RDW: 16.8 % — ABNORMAL HIGH (ref 11.5–15.5)
WBC: 6.8 10*3/uL (ref 4.0–10.5)

## 2017-04-11 LAB — COMPREHENSIVE METABOLIC PANEL
ALBUMIN: 3.7 g/dL (ref 3.5–5.0)
ALT: <5 U/L — ABNORMAL LOW (ref 17–63)
ANION GAP: 8 (ref 5–15)
AST: 21 U/L (ref 15–41)
Alkaline Phosphatase: 89 U/L (ref 38–126)
BILIRUBIN TOTAL: 1.2 mg/dL (ref 0.3–1.2)
BUN: 13 mg/dL (ref 6–20)
CHLORIDE: 97 mmol/L — AB (ref 101–111)
CO2: 25 mmol/L (ref 22–32)
Calcium: 8.9 mg/dL (ref 8.9–10.3)
Creatinine, Ser: 0.94 mg/dL (ref 0.61–1.24)
GFR calc Af Amer: >60 mL/min (ref >60)
GFR calc non Af Amer: >60 mL/min (ref >60)
GLUCOSE: 122 mg/dL — AB (ref 65–99)
POTASSIUM: 4.6 mmol/L (ref 3.5–5.1)
SODIUM: 130 mmol/L — AB (ref 135–145)
TOTAL PROTEIN: 7.1 g/dL (ref 6.5–8.1)

## 2017-04-11 LAB — TROPONIN I: Troponin I: <0.03 ng/mL (ref <0.03)

## 2017-04-11 LAB — BRAIN NATRIURETIC PEPTIDE: B Natriuretic Peptide: 281.5 pg/mL — ABNORMAL HIGH (ref 0.0–100.0)

## 2017-04-11 LAB — D-DIMER, QUANTITATIVE (NOT AT ARMC): D DIMER QUANT: 0.77 ug{FEU}/mL — AB (ref 0.00–0.50)

## 2017-04-11 MED ORDER — HYDROCHLOROTHIAZIDE 25 MG PO TABS
12.5000 mg | ORAL_TABLET | Freq: Every day | ORAL | Status: DC
  Administered 2017-04-12: 12.5 mg via ORAL
  Filled 2017-04-11: qty 1

## 2017-04-11 MED ORDER — IPRATROPIUM-ALBUTEROL 0.5-2.5 (3) MG/3ML IN SOLN
3.0000 mL | Freq: Once | RESPIRATORY_TRACT | Status: AC
  Administered 2017-04-11: 3 mL via RESPIRATORY_TRACT
  Filled 2017-04-11: qty 3

## 2017-04-11 MED ORDER — TAMSULOSIN HCL 0.4 MG PO CAPS
0.4000 mg | ORAL_CAPSULE | Freq: Every day | ORAL | Status: DC
Start: 2017-04-12 — End: 2017-04-16
  Administered 2017-04-12 – 2017-04-16 (×5): 0.4 mg via ORAL
  Filled 2017-04-11 (×5): qty 1

## 2017-04-11 MED ORDER — POTASSIUM CHLORIDE CRYS ER 20 MEQ PO TBCR
20.0000 meq | EXTENDED_RELEASE_TABLET | Freq: Two times a day (BID) | ORAL | Status: DC
  Administered 2017-04-12 – 2017-04-16 (×10): 20 meq via ORAL
  Filled 2017-04-11 (×10): qty 1

## 2017-04-11 MED ORDER — LORAZEPAM 0.5 MG PO TABS: 0.5000 mg | ORAL_TABLET | Freq: Every day | ORAL | Status: DC | PRN

## 2017-04-11 MED ORDER — CARBIDOPA-LEVODOPA 25-100 MG PO TABS
2.0000 | ORAL_TABLET | Freq: Once | ORAL | Status: DC
  Filled 2017-04-11 (×2): qty 2

## 2017-04-11 MED ORDER — FUROSEMIDE 10 MG/ML IJ SOLN
40.0000 mg | Freq: Two times a day (BID) | INTRAMUSCULAR | Status: DC
  Administered 2017-04-12 – 2017-04-14 (×7): 40 mg via INTRAVENOUS
  Filled 2017-04-11 (×8): qty 4

## 2017-04-11 MED ORDER — ENOXAPARIN SODIUM 40 MG/0.4ML ~~LOC~~ SOLN
40.0000 mg | SUBCUTANEOUS | Status: DC
  Administered 2017-04-12 – 2017-04-13 (×2): 40 mg via SUBCUTANEOUS
  Filled 2017-04-11 (×2): qty 0.4

## 2017-04-11 MED ORDER — AMLODIPINE BESYLATE 5 MG PO TABS
5.0000 mg | ORAL_TABLET | Freq: Every day | ORAL | Status: DC
  Administered 2017-04-12 – 2017-04-16 (×5): 5 mg via ORAL
  Filled 2017-04-11 (×5): qty 1

## 2017-04-11 MED ORDER — ATENOLOL 50 MG PO TABS
100.0000 mg | ORAL_TABLET | Freq: Every day | ORAL | Status: DC
  Administered 2017-04-12 – 2017-04-16 (×5): 100 mg via ORAL
  Filled 2017-04-11 (×5): qty 2

## 2017-04-11 MED ORDER — SODIUM CHLORIDE 0.9% FLUSH
3.0000 mL | Freq: Two times a day (BID) | INTRAVENOUS | Status: DC
  Administered 2017-04-12 – 2017-04-15 (×9): 3 mL via INTRAVENOUS

## 2017-04-11 MED ORDER — METHOCARBAMOL 750 MG PO TABS: 750.0000 mg | ORAL_TABLET | Freq: Four times a day (QID) | ORAL | Status: DC | PRN

## 2017-04-11 MED ORDER — DARIFENACIN HYDROBROMIDE ER 7.5 MG PO TB24
7.5000 mg | ORAL_TABLET | Freq: Every day | ORAL | Status: DC
  Administered 2017-04-12 – 2017-04-16 (×5): 7.5 mg via ORAL
  Filled 2017-04-11 (×5): qty 1

## 2017-04-11 MED ORDER — DULOXETINE HCL 30 MG PO CPEP
90.0000 mg | ORAL_CAPSULE | Freq: Every day | ORAL | Status: DC
  Administered 2017-04-12 – 2017-04-16 (×5): 90 mg via ORAL
  Filled 2017-04-11 (×5): qty 1

## 2017-04-11 MED ORDER — POLYETHYLENE GLYCOL 3350 17 G PO PACK
17.0000 g | PACK | Freq: Every day | ORAL | Status: DC | PRN
  Administered 2017-04-14: 17 g via ORAL
  Filled 2017-04-11: qty 1

## 2017-04-11 MED ORDER — FINASTERIDE 5 MG PO TABS
5.0000 mg | ORAL_TABLET | Freq: Every day | ORAL | Status: DC
  Administered 2017-04-12 – 2017-04-16 (×5): 5 mg via ORAL
  Filled 2017-04-11 (×5): qty 1

## 2017-04-11 MED ORDER — CARBIDOPA-LEVODOPA 25-100 MG PO TABS
2.0000 | ORAL_TABLET | Freq: Two times a day (BID) | ORAL | Status: DC
  Administered 2017-04-12 – 2017-04-16 (×8): 2 via ORAL
  Filled 2017-04-11 (×12): qty 2

## 2017-04-11 NOTE — ED Notes (Signed)
Pt now reports hitting his face on the pavement

## 2017-04-11 NOTE — ED Provider Notes (Signed)
[facility]-EMERGENCY DEPT Provider Note   CSN: [id number] Arrival date & time: 04/11/17  1652     History   Chief Complaint Chief Complaint  Patient presents with  . Fall  . Shortness of Breath    HPI Garrett Stewart is a 82 y.o. male.  HPI Patient presents after a fall. States he was walking and fell off the curb. Landed onto his right side and hit his face. No loss conscious. He has had worsening shortness of breath also over the last 3 weeks. Has reportedly put on weight since February. Patient's wife states her to come to the ER anyway due to shortness of breath. Has some wheezing. No chest pain. Increased swelling in his legs. He has normally on Coumadin but has been off it to get a prostate surgery. Past Medical History:  Diagnosis Date  . A-fib (HCC)   . Anxiety   . Arthritis    "mainly in my back" (04/11/2017)  . BPH (benign prostatic hyperplasia)   . CHF (congestive heart failure), NYHA class I, acute on chronic, combined (HCC) 04/11/2017  . Chronic ITP (idiopathic thrombocytopenic purpura) (HCC)   . Chronic lower back pain   . Daily headache    "from Carbidopa" (04/11/2017)  . Depression   . High cholesterol    "I quit taking my RX; I'm allergic to all them" (04/11/2017)  . History of gout   . Hypertension   . Parkinson's disease (HCC)   . Pneumonia 08/2016  . Pulmonary embolism (HCC) ~ 06/2013  . Stroke [facility]    "I've had 2; caused my right foot to fan out" (04/11/2017)    Patient Active Problem List   Diagnosis Date Noted  . Acute on chronic diastolic CHF (congestive heart failure) (HCC) 04/11/2017  . Hyponatremia 04/11/2017  . Chronic atrial fibrillation (HCC) 11/30/2016  . Chronic ITP (idiopathic thrombocytopenia) (HCC) 11/30/2016  . Essential hypertension 11/30/2016  . Parkinson's disease (HCC) 11/30/2016  . Normocytic normochromic anemia 11/30/2016  . History of pulmonary embolism 11/30/2016  . Benign prostatic hyperplasia without urinary obstruction  01/29/2013    Past Surgical History:  Procedure Laterality Date  . CHOLECYSTECTOMY    . HERNIA REPAIR    . INGUINAL HERNIA REPAIR Bilateral   . TONSILLECTOMY    . UMBILICAL HERNIA REPAIR         Home Medications    Prior to Admission medications   Medication Sig Start Date End Date Taking? Authorizing Provider  acetaminophen (TYLENOL) 325 MG tablet Take 650 mg by mouth every 6 (six) hours as needed for moderate pain or headache.   Yes [provider]  traMADol-acetaminophen (ULTRACET) 37.5-325 MG tablet Take 1 tablet by mouth every 6 (six) hours as needed (for pain).  02/23/17  Yes [provider]  amLODipine (NORVASC) 5 MG tablet Take 5 mg by mouth daily. 04/06/17   [provider]  atenolol (TENORMIN) 100 MG tablet Take 100 mg by mouth daily.    [provider]  b complex vitamins tablet Take 1 tablet by mouth daily.    [provider]  busPIRone (BUSPAR) 10 MG tablet Take 10 mg by mouth 3 (three) times daily. 02/23/17   [provider]  carbidopa-levodopa (SINEMET IR) 25-100 MG tablet Take 2 tablets by mouth 3 (three) times daily.  10/25/16   [provider]  DULoxetine (CYMBALTA) 30 MG capsule Take 90 mg by mouth daily.     [provider]  finasteride (PROSCAR) 5 MG tablet Take 5  mg by mouth daily.    [provider]  furosemide (LASIX) 40 MG tablet Take 40 mg by mouth as needed. 02/15/17   [provider]  hydrochlorothiazide (HYDRODIURIL) 25 MG tablet Take 12.5 mg by mouth daily.    [provider]  LORazepam (ATIVAN) 0.5 MG tablet Take 1 tablet (0.5 mg total) by mouth as needed for anxiety. 03/23/17   Lawyer, Cristal Deerhristopher, PA-C  Menthol-Methyl Salicylate (MUSCLE RUB) 10-15 % CREA Apply 1 application topically 2 (two) times daily as needed for muscle pain. 12/03/16   Mikhail, Nita SellsMaryann, DO  methocarbamol (ROBAXIN) 750 MG tablet Take 750 mg by mouth 4 (four) times daily as needed for muscle  spasms.    [provider]  polyethylene glycol (MIRALAX / GLYCOLAX) packet Take 17 g by mouth daily as needed for moderate constipation.    [provider]  tamsulosin (FLOMAX) 0.4 MG CAPS capsule Take 0.4 mg by mouth daily.    [provider]  trospium (SANCTURA) 20 MG tablet Take 20 mg by mouth 2 (two) times daily. 06/24/15   [provider]  warfarin (COUMADIN) 5 MG tablet Take 5 mg by mouth daily.    [provider]    Family History Family History  Problem Relation Age of Onset  . Adopted: Yes  . Lung cancer Son   . Diabetes Child     Social History Social History  Substance Use Topics  . Smoking status: Never Smoker  . Smokeless tobacco: Current User    Types: Chew  . Alcohol use Yes     Comment: 04/11/2017 "go for long periods of time when I don't drink any; then anxiety/depression takes over & I drink too much; last drink was ~ 1 wk ago; hx of EtOH abuse"     Allergies   Statins   Review of Systems Review of Systems  Constitutional: Negative for appetite change and fever.  HENT: Negative for congestion.   Respiratory: Positive for shortness of breath and wheezing.   Cardiovascular: Positive for leg swelling.  Gastrointestinal: Negative for abdominal distention.  Genitourinary: Negative for flank pain.  Musculoskeletal: Negative for back pain.  Neurological: Negative for tremors and numbness.  Hematological: Negative for adenopathy.  Psychiatric/Behavioral: Negative for confusion.     Physical Exam Updated Vital Signs BP 121/62 (BP Location: Left Arm)   Pulse 72   Temp 97.8 F (36.6 C) (Axillary)   Resp 18   Ht 6' (1.829 m)   Wt 110.1 kg (242 lb 11.6 oz)   SpO2 98%   BMI 32.92 kg/m   Physical Exam  Constitutional: He appears well-developed.  HENT:  Head: Atraumatic.  Eyes: EOM are normal.  Neck: Neck supple.  Cardiovascular: Normal rate.   Pulmonary/Chest: No respiratory distress.  Bilateral wheezes.    Abdominal: There is no tenderness.  Musculoskeletal: He exhibits edema.  Edema to bilateral lower extremities. Abrasion to left knee and left elbow without underlying bony tenderness. Good range of motion in knees. Neurovascular intact in hands and feet.  Neurological: He is alert.  Skin: Skin is warm. Capillary refill takes less than 2 seconds.     ED Treatments / Results  Labs (all labs ordered are listed, but only abnormal results are displayed) Labs Reviewed  COMPREHENSIVE METABOLIC PANEL - Abnormal; Notable for the following:       Result Value   Sodium 130 (*)    Chloride 97 (*)    Glucose, Bld 122 (*)    ALT <5 (*)  All other components within normal limits  BRAIN NATRIURETIC PEPTIDE - Abnormal; Notable for the following:    B Natriuretic Peptide 281.5 (*)    All other components within normal limits  CBC WITH DIFFERENTIAL/PLATELET - Abnormal; Notable for the following:    RBC 3.82 (*)    Hemoglobin 11.1 (*)    HCT 34.5 (*)    RDW 16.8 (*)    All other components within normal limits  D-DIMER, QUANTITATIVE (NOT AT [facility]) - Abnormal; Notable for the following:    D-Dimer, Quant 0.77 (*)    All other components within normal limits  TROPONIN I  PROTIME-INR  BASIC METABOLIC PANEL  OSMOLALITY, URINE  SODIUM, URINE, RANDOM  OSMOLALITY  PROTIME-INR    EKG  EKG Interpretation  Date/Time:  Tuesday April 11 2017 20:48:02 EDT Ventricular Rate:  75 PR Interval:    QRS Duration: 107 QT Interval:  418 QTC Calculation: 467 R Axis:   79 Text Interpretation:  Atrial fibrillation Minimal ST depression, inferior leads Confirmed by Benjiman Core [phone]) on 04/11/2017 9:07:34 PM       Radiology Dg Chest 2 View  Result Date: 04/11/2017 CLINICAL DATA:  Mechanical fall from 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 Atherosclerotic calcification aorta. Bibasilar atelectasis. No gross acute infiltrate, pleural effusion or pneumothorax. No acute osseous findings. IMPRESSION: Enlargement of cardiac silhouette with pulmonary vascular congestion. Bibasilar atelectasis. Aortic Atherosclerosis (ICD10-I70.0). Electronically Signed   By: Ulyses Southward M.D.   On: 04/11/2017 18:02   Ct Head Wo Contrast  Result Date: 04/11/2017 CLINICAL DATA:  Patient fell with walker. EXAM: CT HEAD WITHOUT CONTRAST TECHNIQUE: Contiguous axial images were obtained from the base of the skull through the vertex without intravenous contrast. COMPARISON:  03/23/2017 and 09/10/2016 FINDINGS: BRAIN: There is mild crop sulcal and ventricular prominence consistent with superficial and central atrophy. No intraparenchymal hemorrhage, mass effect nor midline shift. Periventricular and subcortical white matter hypodensities consistent with chronic stable small vessel ischemic disease are identified. No acute large vascular territory infarcts. No abnormal extra-axial fluid collections. Basal cisterns are not effaced and midline. VASCULAR: Moderate calcific atherosclerosis of the carotid siphons. SKULL: No skull fracture. No significant scalp soft tissue swelling. Mild nonspecific patchy bony demineralization/osteopenia of the skullbase dating back to 2017. No focal lesions. SINUSES/ORBITS: The mastoid air-cells are clear. The included paranasal sinuses are well-aerated.The included ocular globes and orbital contents are non-suspicious. OTHER: None. IMPRESSION: Chronic stable cerebral atrophy and small vessel ischemic disease. No acute intracranial abnormality. Electronically Signed   By: Tollie Eth M.D.   On: 04/11/2017 18:43    Procedures Procedures (including critical care time)  Medications Ordered in ED Medications  amLODipine (NORVASC) tablet 5 mg  (not administered)  atenolol (TENORMIN) tablet 100 mg (not administered)  carbidopa-levodopa (SINEMET IR) 25-100 MG per tablet immediate release 2 tablet (not administered)  DULoxetine (CYMBALTA) DR capsule 90 mg (not administered)  finasteride (PROSCAR) tablet 5 mg (not administered)  hydrochlorothiazide (HYDRODIURIL) tablet 12.5 mg (not administered)  methocarbamol (ROBAXIN) tablet 750 mg (not administered)  polyethylene glycol (MIRALAX / GLYCOLAX) packet 17 g (not administered)  tamsulosin (FLOMAX) capsule 0.4 mg (not administered)  darifenacin (ENABLEX) 24 hr tablet 7.5 mg (not administered)  enoxaparin (LOVENOX) injection 40 mg (not administered)  sodium chloride flush (NS)  0.9 % injection 3 mL (not administered)  potassium chloride SA (K-DUR,KLOR-CON) CR tablet 20 mEq (not administered)  furosemide (LASIX) injection 40 mg (not administered)  carbidopa-levodopa (SINEMET IR) 25-100 MG per tablet immediate release 2 tablet (not administered)  LORazepam (ATIVAN) tablet 0.5 mg (not administered)  carbidopa-levodopa (SINEMET IR) 25-100 MG per tablet immediate release 1 tablet (not administered)  ipratropium-albuterol (DUONEB) 0.5-2.5 (3) MG/3ML nebulizer solution 3 mL (3 mLs Nebulization Given 04/11/17 1728)     Initial Impression / Assessment and Plan / ED Course  I have reviewed the triage vital signs and the nursing notes.  Pertinent labs & imaging results that were available during my care of the patient were reviewed by me and considered in my medical decision making (see chart for details).     Patient presented after fall. Became unsteady and fell. That is not unusual for him. He however has had worsening shortness breath over the last few days. Has continued to put on weight. May not up and taking the Lasix as needed. Had some swelling in his legs. BNP is elevated. X-ray shows possible CHF. He is however wheezing. No history of COPD. Attempted ambulation and sats dipped down into  the 80s. Admit to internal medicine. Previous history of pulmonary embolism but that was when he was on a different medicine for his cytopenia. I think it is unlikely a pulmonary embolism at this time. Admit to internal medicine.  Final Clinical Impressions(s) / ED Diagnoses   Final diagnoses:  Hypoxia    New Prescriptions Current Discharge Medication List       Benjiman Core, MD 04/12/17 0020

## 2017-04-11 NOTE — ED Notes (Addendum)
Pt returned to the hallway from Xray

## 2017-04-11 NOTE — ED Notes (Signed)
Pt Being taken to Xray

## 2017-04-11 NOTE — ED Notes (Signed)
Per significant other, pt is scheduled for a TURP on Friday. Has not been taking lasix as prescribed due to increased urination with little output during multiple trips to the restroom. Pt's work of breathing increased just from turning in the bed.

## 2017-04-11 NOTE — ED Notes (Signed)
Pt returned from CT °

## 2017-04-11 NOTE — ED Notes (Signed)
Pt taken to CT.

## 2017-04-11 NOTE — H&P (Signed)
History and Physical  Patient Name: Garrett Stewart     ZOX:[id number]    DOB: 1934-11-29    DOA: 04/11/2017 PCP: Paulino Door, MD  Patient coming from: Home  Chief Complaint: Dyspnea      HPI: Ricahrd Schwager is a 82 y.o. male with a past medical history significant for ITP not currently on Nplate, hx of PE, chronic Afib on warfarin, Parkinsons, HTN, and hx of spontaneous ICH (with platelets 1K in 2013) who presents with progressive dyspnea for 1 week.  The patient was in his usual state of health until about one week ago he started to have shortness of breath, worse with exertion. His girlfriend endorses that she's noticed him breathing "harder" and "faster". A week ago he had a fall because of his Parkinson's, and on his right side, has had some right rib pain since then, this was evaluated and he had no fracture. But over the course of the last week, he and his girlfriend have noticed that his weight has been increasing (he thinks his dry weight is around 210 10/28/2018, today at his neurologist office was 242), and he has had leg swelling and orthopnea.  Of note, he has severe BPH and does not take his Lasix, for a while.  He has had no fever, cough, sputum production.  He has had no chest pain similar to his PE a few years ago.  He is currently off warfarin for a TURBT planned on Friday this week.  ED course: -Afebrile, heart rate 71, respirations 22, pulse ox 80s on room air, 94% on 2 L nasal cannula, blood pressure 128/67 -Na 130, K 4.6, Cr 0.94, WBC 6.8K, Hgb 11.1 (baseline 13, normocytic), platelets 226K -BNP 281 -Troponin negative -Chest x-ray clear -D-dimer less than his age adjusted cut off, wells score only 1.5 -CT head unremarkable (he had had a fall outside a restaurant before presenting today) -ECG showed chronic A. fib -TRH were asked to evaluate for dyspnea         ROS: Review of Systems  Constitutional: Negative for chills and fever.  Respiratory: Positive for  shortness of breath. Negative for cough and sputum production.   Cardiovascular: Positive for orthopnea and leg swelling. Negative for chest pain.  Musculoskeletal: Positive for falls.  All other systems reviewed and are negative.         Past Medical History:  Diagnosis Date  . A-fib (HCC)   . BPH (benign prostatic hyperplasia)   . Chronic ITP (idiopathic thrombocytopenic purpura) (HCC)   . Parkinson's disease (HCC)   . Pulmonary embolism (HCC)   . Stroke [facility])     Past Surgical History:  Procedure Laterality Date  . CHOLECYSTECTOMY      Social History: Patient lives by himself.  The patient walks with a walker.  Nonsmoker.  From [place] originally, the [place].  Was in [employer] pl[employer].  Allergies  Allergen Reactions  . Statins     Reaction unknown to patient    Family history: Was adopted. family history includes Diabetes in his child; Lung cancer in his son. He was adopted.  Prior to Admission medications   Medication Sig Start Date End Date Taking? Authorizing Provider  acetaminophen (TYLENOL) 325 MG tablet Take 650 mg by mouth every 6 (six) hours as needed for moderate pain or headache.    [provider]  amLODipine (NORVASC) 5 MG tablet Take 5 mg by mouth daily. 04/06/17   [provider]  atenolol (TENORMIN) 100 MG tablet Take  100 mg by mouth daily.    [provider]  b complex vitamins tablet Take 1 tablet by mouth daily.    [provider]  busPIRone (BUSPAR) 10 MG tablet Take 10 mg by mouth 3 (three) times daily. 02/23/17   [provider]  carbidopa-levodopa (SINEMET IR) 25-100 MG tablet Take 2 tablets by mouth 3 (three) times daily.  10/25/16   [provider]  DULoxetine (CYMBALTA) 30 MG capsule Take 90 mg by mouth daily.     [provider]  finasteride (PROSCAR) 5 MG tablet Take 5 mg by mouth daily.    [provider]  furosemide (LASIX) 40 MG tablet Take 40 mg by mouth as needed.  02/15/17   [provider]  hydrochlorothiazide (HYDRODIURIL) 25 MG tablet Take 12.5 mg by mouth daily.    [provider]  LORazepam (ATIVAN) 0.5 MG tablet Take 1 tablet (0.5 mg total) by mouth as needed for anxiety. 03/23/17   Lawyer, Cristal Deer, PA-C  Menthol-Methyl Salicylate (MUSCLE RUB) 10-15 % CREA Apply 1 application topically 2 (two) times daily as needed for muscle pain. 12/03/16   Mikhail, Nita Sells, DO  methocarbamol (ROBAXIN) 750 MG tablet Take 750 mg by mouth 4 (four) times daily as needed for muscle spasms.    [provider]  polyethylene glycol (MIRALAX / GLYCOLAX) packet Take 17 g by mouth daily as needed for moderate constipation.    [provider]  tamsulosin (FLOMAX) 0.4 MG CAPS capsule Take 0.4 mg by mouth daily.    [provider]  traMADol-acetaminophen (ULTRACET) 37.5-325 MG tablet Take 1 tablet by mouth as needed. 02/23/17   [provider]  trospium (SANCTURA) 20 MG tablet Take 20 mg by mouth 2 (two) times daily. 06/24/15   [provider]  warfarin (COUMADIN) 5 MG tablet Take 5 mg by mouth daily.    [provider]       Physical Exam: BP 135/83 (BP Location: Left Arm)   Pulse 65   Temp 98.5 F (36.9 C) (Oral)   Resp (!) 22   SpO2 90%  General appearance: Well-developed, obese adult male, alert and in mild distress from dyspnea.   Eyes: Anicteric, conjunctiva pink, lids and lashes normal. PERRL.    ENT: No nasal deformity, discharge, epistaxis.  Hearing normal. OP moist without lesions.   Neck: No neck masses.  Trachea midline.  No thyromegaly/tenderness. Lymph: No cervical or supraclavicular lymphadenopathy. Skin: Warm and dry.  No jaundice.  No suspicious rashes or lesions. Cardiac: RRR, nl S1-S2, no murmurs appreciated.  Capillary refill is brisk.  JVP elevated.  2+ LE edema to knees.  Radial pulses symmetric, somewhat diminished. Respiratory: Tachypneic, labored with exertion.  Rales at  bilateral bases, scattered wheezes.. Abdomen: Abdomen soft.  No TTP. No ascites, distension, hepatosplenomegaly.   MSK: No deformities or effusions.  No cyanosis or clubbing. Neuro: Cranial nerves 3-12 intact.  Sensation intact to light touch. Speech is fluent.  Muscle strength normal.   Resting tremor, coarse.  Movements slow.  Flat facies. Psych: Sensorium intact and responding to questions, attention normal.  Behavior appropriate.  Affect flat.  Judgment and insight appear normal.     Labs on Admission:  I have personally reviewed following labs and imaging studies: CBC:  Recent Labs Lab 04/11/17 1735  WBC 6.8  NEUTROABS 5.0  HGB 11.1*  HCT 34.5*  MCV 90.3  PLT 226   Basic Metabolic Panel:  Recent Labs Lab 04/11/17 1735  NA 130*  K 4.6  CL 97*  CO2 25  GLUCOSE 122*  BUN 13  CREATININE 0.94  CALCIUM 8.9   GFR: Estimated Creatinine Clearance: 81.6 mL/min (by C-G formula based on SCr of 0.94 mg/dL).  Liver Function Tests:  Recent Labs Lab 04/11/17 1735  AST 21  ALT <5*  ALKPHOS 89  BILITOT 1.2  PROT 7.1  ALBUMIN 3.7  Cardiac Enzymes:  Recent Labs Lab 04/11/17 1735  TROPONINI <0.03         Radiological Exams on Admission: Personally reviewed CXR shows no congestion or edema actually: Dg Chest 2 View  Result Date: 04/11/2017 CLINICAL DATA:  Mechanical fall from her restaurant today onto LEFT side, shortness of breath for 3 weeks, 15 pound weight gain in 3 weeks, history stroke, atrial fibrillation, pulmonary embolism, Parkinson's EXAM: CHEST  2 VIEW COMPARISON:  03/23/2017 FINDINGS: Enlargement of cardiac silhouette with pulmonary vascular congestion. Atherosclerotic calcification aorta. Bibasilar atelectasis. No gross acute infiltrate, pleural effusion or pneumothorax. No acute osseous findings. IMPRESSION: Enlargement of cardiac silhouette with pulmonary vascular congestion. Bibasilar atelectasis. Aortic Atherosclerosis (ICD10-I70.0). Electronically  Signed   By: Ulyses SouthwardMark  Boles M.D.   On: 04/11/2017 18:02   Ct Head Wo Contrast  Result Date: 04/11/2017 CLINICAL DATA:  Patient fell with walker. EXAM: CT HEAD WITHOUT CONTRAST TECHNIQUE: Contiguous axial images were obtained from the base of the skull through the vertex without intravenous contrast. COMPARISON:  03/23/2017 and 09/10/2016 FINDINGS: BRAIN: There is mild crop sulcal and ventricular prominence consistent with superficial and central atrophy. No intraparenchymal hemorrhage, mass effect nor midline shift. Periventricular and subcortical white matter hypodensities consistent with chronic stable small vessel ischemic disease are identified. No acute large vascular territory infarcts. No abnormal extra-axial fluid collections. Basal cisterns are not effaced and midline. VASCULAR: Moderate calcific atherosclerosis of the carotid siphons. SKULL: No skull fracture. No significant scalp soft tissue swelling. Mild nonspecific patchy bony demineralization/osteopenia of the skullbase dating back to 2017. No focal lesions. SINUSES/ORBITS: The mastoid air-cells are clear. The included paranasal sinuses are well-aerated.The included ocular globes and orbital contents are non-suspicious. OTHER: None. IMPRESSION: Chronic stable cerebral atrophy and small vessel ischemic disease. No acute intracranial abnormality. Electronically Signed   By: Tollie Ethavid  Kwon M.D.   On: 04/11/2017 18:43    EKG: Independently reviewed. Rate 75, QTc normal, Afib, no st changes.  Echocardiogram May 2018 in Care[facility]where: Report reviewed EF 50-55% Mild to moderate TR MR Increased RVSP         Assessment/Plan  1. Acute on chronic diastolic CHF:  Elevated BNP, dyspnea, orthopnea, and leg swelling while stopping Lasix at home and noiticing weight gain.  Very low suspicion for PE, wells 1.5 only.   -Furosemide 40 mg IV twice a day  -K supplement -Strict I/Os, daily weights, telemetry  -Daily monitoring renal  function -Echocardiogram done recently, result in CareEverywhere and listed above -Continue beta-blocker   2. Hyponatremia:  Hypervolemic. -Check urine sodium and Uosm/Posm  3. Normocytic anemia:  Mild, somewhat new.  No report of bleeding. -Trend Hgb  4. ITP:  Inactive  5. Atrial fibrillation:  CHADS2Vasc 4, on warfarin, which is held for his procedure.   -Continue atenolol  6. Parkinson's disease:  -Continue Sinemet  7. Hypertension:  -Continue atenolol, HCTZ, amlodipine  8. Bladder outlet dysfunction: -Continue Sanctura, Flomax, Proscar  9. Other medications: -Continue lorazepam and Robaxin PRN          DVT prophylaxis: Lovenox  Code Status: FULL  Family Communication: Girlfriend at bedside, overnight  plan discused CODE Status confirmed.  Disposition Plan: Anticipate IV diruesis.  May need 2-3 days of diuresis, unclear at this time. Consults called: None Admission status: OBS for now, may need to be converted to inpatient if still hypoxic tomorrow. At the point of initial evaluation, it is my clinical opinion that admission for OBSERVATION is reasonable and necessary because the patient's presenting complaints in the context of their chronic conditions represent sufficient risk of deterioration or significant morbidity to constitute reasonable grounds for close observation in the hospital setting, but that the patient may be medically stable for discharge from the hospital within 24 to 48 hours.    Medical decision making: Patient seen at 9:45 PM on 04/11/2017.  The patient was discussed with Dr. Rubin Payor.  What exists of the patient's chart was reviewed in depth and summarized above.  Clinical condition: stable.        Alberteen Sam [facility] Pager [phone]

## 2017-04-11 NOTE — ED Triage Notes (Addendum)
Pt presents to the ed after having a mechanical fall in front of a restaurant today on his left side. Denies any LOC, or head injury. Pt is also having shortness of breath that has been going on for 3 weeks and has had an increase in 15 pounds in 3 weeks. He is supposed to take lasix but has not been taking it appropriately. Pt received 5 mg of albuterol en route with ems. Pt does take a blood thinner.

## 2017-04-11 NOTE — Patient Instructions (Addendum)
1. We [facility] and they will call you directly to schedule your appt. They are located at [address] If you need to contact them directly please call [phone].  2. We have you scheduled on 07/18/17 at 3:00 pm to follow back up in our office. Do not take Carbidopa Levodopa this day.

## 2017-04-11 NOTE — ED Notes (Signed)
MD Pickering at the bedside 

## 2017-04-11 NOTE — ED Notes (Signed)
Patients O2 stat was 83% while ambulating.

## 2017-04-11 NOTE — ED Notes (Signed)
Pt given ginger ale.

## 2017-04-12 ENCOUNTER — Encounter (HOSPITAL_COMMUNITY): Payer: Self-pay | Admitting: General Practice

## 2017-04-12 DIAGNOSIS — I5033 Acute on chronic diastolic (congestive) heart failure: Secondary | ICD-10-CM

## 2017-04-12 DIAGNOSIS — D649 Anemia, unspecified: Secondary | ICD-10-CM | POA: Diagnosis present

## 2017-04-12 DIAGNOSIS — F419 Anxiety disorder, unspecified: Secondary | ICD-10-CM | POA: Diagnosis present

## 2017-04-12 DIAGNOSIS — F329 Major depressive disorder, single episode, unspecified: Secondary | ICD-10-CM | POA: Diagnosis present

## 2017-04-12 DIAGNOSIS — Z7901 Long term (current) use of anticoagulants: Secondary | ICD-10-CM | POA: Diagnosis not present

## 2017-04-12 DIAGNOSIS — N400 Enlarged prostate without lower urinary tract symptoms: Secondary | ICD-10-CM | POA: Diagnosis present

## 2017-04-12 DIAGNOSIS — D693 Immune thrombocytopenic purpura: Secondary | ICD-10-CM | POA: Diagnosis present

## 2017-04-12 DIAGNOSIS — G20 Parkinson's disease: Secondary | ICD-10-CM | POA: Diagnosis present

## 2017-04-12 DIAGNOSIS — I110 Hypertensive heart disease with heart failure: Secondary | ICD-10-CM | POA: Diagnosis present

## 2017-04-12 DIAGNOSIS — I482 Chronic atrial fibrillation: Secondary | ICD-10-CM | POA: Diagnosis present

## 2017-04-12 DIAGNOSIS — I509 Heart failure, unspecified: Secondary | ICD-10-CM

## 2017-04-12 DIAGNOSIS — E871 Hypo-osmolality and hyponatremia: Secondary | ICD-10-CM | POA: Diagnosis present

## 2017-04-12 DIAGNOSIS — Z9114 Patient's other noncompliance with medication regimen: Secondary | ICD-10-CM | POA: Diagnosis not present

## 2017-04-12 DIAGNOSIS — R0902 Hypoxemia: Secondary | ICD-10-CM | POA: Diagnosis present

## 2017-04-12 DIAGNOSIS — I10 Essential (primary) hypertension: Secondary | ICD-10-CM | POA: Diagnosis not present

## 2017-04-12 DIAGNOSIS — I360 Nonrheumatic tricuspid (valve) stenosis: Secondary | ICD-10-CM | POA: Diagnosis not present

## 2017-04-12 DIAGNOSIS — Z86711 Personal history of pulmonary embolism: Secondary | ICD-10-CM | POA: Diagnosis not present

## 2017-04-12 DIAGNOSIS — I5031 Acute diastolic (congestive) heart failure: Secondary | ICD-10-CM | POA: Diagnosis not present

## 2017-04-12 DIAGNOSIS — Z79899 Other long term (current) drug therapy: Secondary | ICD-10-CM | POA: Diagnosis not present

## 2017-04-12 DIAGNOSIS — I081 Rheumatic disorders of both mitral and tricuspid valves: Secondary | ICD-10-CM | POA: Diagnosis present

## 2017-04-12 LAB — PROTIME-INR
INR: 1.15
INR: 1.18
PROTHROMBIN TIME: 15 s (ref 11.4–15.2)
Prothrombin Time: 14.8 seconds (ref 11.4–15.2)

## 2017-04-12 LAB — BASIC METABOLIC PANEL
ANION GAP: 10 (ref 5–15)
BUN: 13 mg/dL (ref 6–20)
CHLORIDE: 97 mmol/L — AB (ref 101–111)
CO2: 26 mmol/L (ref 22–32)
Calcium: 8.7 mg/dL — ABNORMAL LOW (ref 8.9–10.3)
Creatinine, Ser: 0.85 mg/dL (ref 0.61–1.24)
GFR calc Af Amer: >60 mL/min (ref >60)
Glucose, Bld: 95 mg/dL (ref 65–99)
POTASSIUM: 4 mmol/L (ref 3.5–5.1)
SODIUM: 133 mmol/L — AB (ref 135–145)

## 2017-04-12 LAB — OSMOLALITY: OSMOLALITY: 280 mosm/kg (ref 275–295)

## 2017-04-12 MED ORDER — CARBIDOPA-LEVODOPA 25-100 MG PO TABS
1.0000 | ORAL_TABLET | ORAL | Status: DC
  Administered 2017-04-12 – 2017-04-16 (×9): 1 via ORAL
  Filled 2017-04-12 (×5): qty 1

## 2017-04-12 NOTE — Progress Notes (Signed)
Responded to consult for prayer and talk and visited w/ pt. It's hard sometimes to understand his speech, which he's aware of. After a few minutes, he focused on my name badge and realized I was chaplain, so don't know if there is some problem w/ his hearing or comprehension too? He was asking me (I believe) could he have pets here in hospital? I said he'd have to ask his nurse, I couldn't do anything medical, though I could pray with him. He was considering that when his phone rang, so asked me to come another time. Chaplain available for f/u.    04/12/17 1400  Clinical Encounter Type  Visited With Patient  Visit Type Initial;Psychological support;Spiritual support;Social support  Referral From Nurse  Spiritual Encounters  Spiritual Needs Prayer;Emotional  Stress Factors  Patient Stress Factors Health changes;Loss of control   Ephraim Hamburgerynthia A Dela Sweeny, [address] Hospital [address]haplain

## 2017-04-12 NOTE — Consult Note (Signed)
Cardiology Consultation:   Patient ID: Garrett Stewart; [id number]; [birth year]   Admit date: 04/11/2017 Date of Consult: 04/12/2017  Primary Care Provider: Paulino Door, MD Primary Cardiologist: Dr Garrett Stewart, Garrett Stewart Primary Electrophysiologist:  none   Patient Profile:   Garrett Stewart is a 82 y.o. 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  History of Present Illness:   Garrett Stewart had fallen about a week prior to admission and was evaluated and found to have no fractures. Since then, the family had noted an increase in his weight thought to be up from 210 pounds to 242 pounds as well as increased leg swelling. The patient states that he has been feeling very fatigued and not himself for the past 2-3 months and the cause was not as of yet determined. He was out to eat at a restaurant yesterday when he fell again and currently has sore ribs on the left. He has been having increased dyspnea and orthopnea for the last 2 nights being unable to sleep. He did not perceive increase in peripheral edema. He has diuretics to take at home as needed and admits that he does not like the frequent urination associated and he has not taken any in a long time. He is feeling 100% better today with improved breathing and he says even his fatigue is improved. He says that he has planned prostate surgery for Friday in [place] and was told to stop his Coumadin on Monday.  He has a history of a stroke and has a deformity of his left foot. He uses a rolling walker.  Outpatient wts: 01/05/17  231 lbs 02/17/17  243 pounds 04/06/17  239 pounds   The patient was recently seen in the emergency department at [facility] on 02/25/2017 for complaints of generalized pain. He was found  to be intoxicated due to alcohol and chest pain was thought to be noncardiac in nature. Chest x-ray at that time showed low lung volumes and bibasilar atelectasis but no edema, pneumonia, or masses.  Pertinent findings include: EKG shows atrial fibrillation with controlled rate at 75 bpm Negative troponin, BNP 281.5. Normal potassium at 4.0. Normal kidney function with creatinine of 0.85.  Mild anemia with hemoglobin 11.1.  Platelet count of 226K INR is subtherapeutic at 1.18  D-dimer was mildly elevated at 0.77 Chest x-ray showed enlargement of the cardiac silhouette with pulmonary vascular congestion. By basilar atelectasis and aortic atherosclerosis CT of the head showed chronic stable cerebral atrophy and small vessel ischemic disease. No acute intracranial abnormality.    Past Medical History:  Diagnosis Date  . A-fib (HCC)   . Anxiety   . Arthritis    "mainly in my back" (04/11/2017)  . BPH (benign prostatic hyperplasia)   . CHF (congestive heart failure), NYHA class I, acute on chronic, combined (HCC) 04/11/2017  . Chronic ITP (idiopathic thrombocytopenic purpura) (HCC)   . Chronic lower back pain   . Daily headache    "from Carbidopa" (04/11/2017)  . Depression   . High cholesterol    "I quit taking my RX; I'm allergic to all them" (04/11/2017)  . History of gout   . Hypertension   . Parkinson's disease (HCC)   . Pneumonia 08/2016  . Pulmonary embolism (HCC) ~ 06/2013  . Stroke (  HCC)    "I've had 2; caused my right foot to fan out" (04/11/2017)    Past Surgical History:  Procedure Laterality Date  . CHOLECYSTECTOMY    . HERNIA REPAIR    . INGUINAL HERNIA REPAIR Bilateral   . TONSILLECTOMY    . UMBILICAL HERNIA REPAIR       Inpatient Medications: Scheduled Meds: . amLODipine  5 mg Oral Daily  . atenolol  100 mg Oral Daily  . carbidopa-levodopa  1 tablet Oral 2 times per day  . carbidopa-levodopa  2 tablet Oral BID WC  . carbidopa-levodopa  2 tablet Oral Once  .  darifenacin  7.5 mg Oral Daily  . DULoxetine  90 mg Oral Daily  . enoxaparin (LOVENOX) injection  40 mg Subcutaneous Q24H  . finasteride  5 mg Oral Daily  . furosemide  40 mg Intravenous BID  . potassium chloride  20 mEq Oral BID  . sodium chloride flush  3 mL Intravenous Q12H  . tamsulosin  0.4 mg Oral Daily   Continuous Infusions:  PRN Meds: LORazepam, methocarbamol, polyethylene glycol  Allergies:    Allergies  Allergen Reactions  . Statins Other (See Comments)    Reaction unknown to patient    Social History:   Social History   Social History  . Marital status: Widowed    Spouse name: N/A  . Number of children: N/A  . Years of education: N/A   Occupational History  . retired     Veterinary surgeonmanager electronics repair   Social History Main Topics  . Smoking status: Never Smoker  . Smokeless tobacco: Current User    Types: Chew  . Alcohol use Yes     Comment: 04/11/2017 "go for long periods of time when I don't drink any; then anxiety/depression takes over & I drink too much; last drink was ~ 1 wk ago; hx of EtOH abuse"  . Drug use: No  . Sexual activity: Not Currently   Other Topics Concern  . Not on file   Social History Narrative  . No narrative on file    Family History:   The patient's family history includes Diabetes in his child; Lung cancer in his son. He was adopted.  ROS:  Please see the history of present illness.  ROS  All other ROS reviewed and negative.     Physical Exam/Data:   Vitals:   04/11/17 2213 04/11/17 2332 04/12/17 0623 04/12/17 0659  BP: (!) 139/95 121/62 126/63   Pulse: 71 72 73   Resp:  18 18   Temp:  97.8 F (36.6 C) 97.9 F (36.6 C)   TempSrc:  Axillary Oral   SpO2: 98%  97%   Weight:  242 lb 11.6 oz (110.1 kg)  237 lb 14 oz (107.9 kg)  Height:  6' (1.829 m)      Intake/Output Summary (Last 24 hours) at 04/12/17 1239 Last data filed at 04/12/17 1040  Gross per 24 hour  Intake              320 ml  Output             3100 ml   Net            -2780 ml   Filed Weights   04/11/17 2332 04/12/17 0659  Weight: 242 lb 11.6 oz (110.1 kg) 237 lb 14 oz (107.9 kg)   Body mass index is 32.26 kg/m.  General:  Chronically ill-appearing male, in no acute distress HEENT: normal  Neck: no JVD Endocrine:  No thryomegaly Vascular: No carotid bruits, weak pedal pulses Cardiac:  normal S1, S2; irregularly irregular rhythm; no murmur  Lungs:  clear to auscultation bilaterally, no wheezing, rhonchi or rales  Abd: soft, nontender, no hepatomegaly  Ext: Trace pretibial edema Musculoskeletal:  Left foot with bony abnormality Skin: warm and dry  Neuro:  CNs 2-12 intact, no focal abnormalities noted Psych:  Normal affect   EKG:  The EKG was personally reviewed and demonstrates:   atrial fibrillation with controlled rate at 75 bpm Telemetry:  Telemetry was personally reviewed and demonstrates:  Atrial fibrillation with good rate control in the 70s  Relevant CV Studies:  Echocardi[facility] 02/17/17 at [facility]   Interpretation Summary  A complete two-dimensional transthoracic echocardiogram was performed (2D,  M-mode, Doppler and color flow Doppler). The study was technically  adequate. There is no comparison study available.  Ejection Fraction = >55%.  There is mild to moderate mitral regurgitation.  There is mild to moderate tricuspid regurgitation.  Right ventricular systolic pressure is elevated at 50-63mmHg.     Laboratory Data:  Chemistry Recent Labs Lab 04/11/17 1735 04/12/17 0340  NA 130* 133*  K 4.6 4.0  CL 97* 97*  CO2 25 26  GLUCOSE 122* 95  BUN 13 13  CREATININE 0.94 0.85  CALCIUM 8.9 8.7*  GFRNONAA >60 >60  GFRAA >60 >60  ANIONGAP 8 10     Recent Labs Lab 04/11/17 1735  PROT 7.1  ALBUMIN 3.7  AST 21  ALT <5*  ALKPHOS 89  BILITOT 1.2   Hematology Recent Labs Lab 04/11/17 1735  WBC 6.8  RBC 3.82*  HGB 11.1*  HCT 34.5*  MCV 90.3  MCH 29.1  MCHC 32.2  RDW  16.8*  PLT 226   Cardiac Enzymes Recent Labs Lab 04/11/17 1735  TROPONINI <0.03   No results for input(s): TROPIPOC in the last 168 hours.  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 Atherosclerotic calcification aorta. Bibasilar atelectasis. No gross acute infiltrate, pleural effusion or pneumothorax. No acute osseous findings. IMPRESSION: Enlargement of cardiac silhouette with pulmonary vascular congestion. Bibasilar atelectasis. Aortic Atherosclerosis (ICD10-I70.0). Electronically Signed   By: Ulyses Southward M.D.   On: 04/11/2017 18:02   Ct Head Wo Contrast  Result Date: 04/11/2017 CLINICAL DATA:  Patient fell with walker. EXAM: CT HEAD WITHOUT CONTRAST TECHNIQUE: Contiguous axial images were obtained from the base of the skull through the vertex without intravenous contrast. COMPARISON:  03/23/2017 and 09/10/2016 FINDINGS: BRAIN: There is mild crop sulcal and ventricular prominence consistent with superficial and central atrophy. No intraparenchymal hemorrhage, mass effect nor midline shift. Periventricular and subcortical white matter hypodensities consistent with chronic stable small vessel ischemic disease are identified. No acute large vascular territory infarcts. No abnormal extra-axial fluid collections. Basal cisterns are not effaced and midline. VASCULAR: Moderate calcific atherosclerosis of the carotid siphons. SKULL: No skull fracture. No significant scalp soft tissue swelling. Mild nonspecific patchy bony  demineralization/osteopenia of the skullbase dating back to 2017. No focal lesions. SINUSES/ORBITS: The mastoid  air-cells are clear. The included paranasal sinuses are well-aerated.The included ocular globes and orbital contents are non-suspicious. OTHER: None. IMPRESSION: Chronic stable cerebral atrophy and small vessel ischemic disease. No acute intracranial abnormality. Electronically Signed   By: Tollie Eth M.D.   On: 04/11/2017 18:43    Assessment and Plan:   1. Acute diastolic heart failure: recent echocardiogram done on 02/17/17 at [facility] showed a normal left ventricular systolic function with EF greater than 55%, mild-to-moderate mitral regurgitation and Mild to moderate tricuspid regurgitation and elevated right ventricular systolic pressure of 50-60 mmHg. Outpatient medications include atenolol, Lasix 40 mg daily as needed (patient admits that he does not like taking diuretics due to frequent urination and rarely takes it). Admitted with complaints of dyspnea on exertion, orthopnea and inability to sleep. BNP is mildly elevated at 281.5. Chest x-ray shows pulmonary vascular congestion. Patient is currently being treated with Lasix 40 mg IV twice daily, has received 2 doses along with hydrochlorothiazide 12.5 mg daily. Weight is down 5 pounds from 242 pounds to 237 pounds. Weight on 02/17/17 was 243 pounds, on 04/06/17  239 pounds per her outpatient office notes. Fluid balance is -2.7 L just since yesterday. Patient is feeling much better with improved breathing and improvement in fatigue. Continue to diurese and likely needs a routine diuretic at home and follow up with his cardiologist. Dr. Rennis Golden to see.   2. Chronic atrial fibrillation: The patient has been treated by Dr. Wynonia Stewart of [facility] for chronic afib and on coumadin. CHA2DS2/VAS Stroke Risk Score is 6 (presumed CHF, HTN, Age (2), stroke (2)). Patient states that he is off of Coumadin since Monday for planned  prostate surgery on Friday in [place]. Continues in rate controlled atrial fib. Managed in the outpatient setting on atenolol 100 mg daily.      3. Hypertension: Managed in the outpatient setting with amlodipine 5 mg and atenolol 100 mg. Blood pressure is well controlled   Signed, Berton Bon, NP  04/12/2017 12:39 PM

## 2017-04-12 NOTE — Progress Notes (Addendum)
Patient ID: Garrett Stewart, male   DOB: 06-May-1935, 82 y.o.   MRN: [id number]  PROGRESS NOTE    Jenkins Risdon  FAO:[id number] DOB: 1934/11/23 DOA: 04/11/2017  PCP: Paulino Door, MD   Brief Narrative:  82 year old male with ITP, history of PE, chronic A fib on coumadin, Parkinson disease, hypertension, 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 Pt presented to ED with 1 week of worsening dyspnea on exertion. He has fallen about a week prior to admission, was evaluated and found to have no fractures. Since then, family has noticed his weight has been increasing (thought to be 210 lbs and now 242 lbs) and he had increased leg swelling.  In ED, pt was afebrile, RR 22 and pulse ox in 80's but this has improved to 94% with 2 L [place] oxygen support. CXR showed no acute findings. CT head unremarkable. Troponin was negative. EKG showed chronic a fib.   Assessment & Plan:   Principal Problem:   Acute on chronic diastolic CHF (congestive heart failure) (HCC) - Started lasix 40 mg IV BID - Cardio consulted - Obtain 2 D ECHO - Continue daily weight and strict intake and output - Replete electrolytes as needed  Active Problems:   Chronic atrial fibrillation (HCC) - CHADS vasc score at least 3 - On coumadin which is now on hold for TURP this friday - Rate controlled with atenolol    Essential hypertension - Continue Norvasc, atenolol and lasix     BPH - Continue Proscar and Flomax - Continue enablex - Check with [facility] where is TURP supposed to be done, pt unable to give this history    Parkinson disease - Continue sinemet    Chronic ITP (idiopathic thrombocytopenia) (HCC) - Monitor daily CBC - Platelets 226    Normocytic normochromic anemia - Monitor daily CBC - Hgb 11,1    History of pulmonary embolism - On coumadin but now on hold for TURP this Friday    DVT prophylaxis: Lovenox subQ, SCD's Code Status: full code  Family Communication: no family at the bedside    Disposition Plan: home or SNF once adequately diuresed    Consultants:   PT  Cardio   Procedures:   None  Antimicrobials:   None    Subjective: No overnight events.  Objective: Vitals:   04/11/17 2213 04/11/17 2332 04/12/17 0623 04/12/17 0659  BP: (!) 139/95 121/62 126/63   Pulse: 71 72 73   Resp:  18 18   Temp:  97.8 F (36.6 C) 97.9 F (36.6 C)   TempSrc:  Axillary Oral   SpO2: 98%  97%   Weight:  110.1 kg (242 lb 11.6 oz)  107.9 kg (237 lb 14 oz)  Height:  6' (1.829 m)      Intake/Output Summary (Last 24 hours) at 04/12/17 1122 Last data filed at 04/12/17 1039  Gross per 24 hour  Intake              320 ml  Output             2100 ml  Net            -1780 ml   Filed Weights   04/11/17 2332 04/12/17 0659  Weight: 110.1 kg (242 lb 11.6 oz) 107.9 kg (237 lb 14 oz)    Examination:  General exam: Appears calm and comfortable  Respiratory system: Rales at bases, no wheezing  Cardiovascular system: S1 & S2 heard, Rate controlled  Gastrointestinal system:  Abdomen is distended, (+) BS Central nervous system: No focal neurological deficits. Extremities: Symmetric 5 x 5 power. +1-2 LE edema Skin: No rashes, lesions or ulcers Psychiatry: Normal mood and behavior    Data Reviewed: I have personally reviewed following labs and imaging studies  CBC:  Recent Labs Lab 04/11/17 1735  WBC 6.8  NEUTROABS 5.0  HGB 11.1*  HCT 34.5*  MCV 90.3  PLT 226   Basic Metabolic Panel:  Recent Labs Lab 04/11/17 1735 04/12/17 0340  NA 130* 133*  K 4.6 4.0  CL 97* 97*  CO2 25 26  GLUCOSE 122* 95  BUN 13 13  CREATININE 0.94 0.85  CALCIUM 8.9 8.7*   GFR: Estimated Creatinine Clearance: 89.4 mL/min (by C-G formula based on SCr of 0.85 mg/dL). Liver Function Tests:  Recent Labs Lab 04/11/17 1735  AST 21  ALT <5*  ALKPHOS 89  BILITOT 1.2  PROT 7.1  ALBUMIN 3.7   No results for input(s): LIPASE, AMYLASE in the last 168 hours. No results for input(s):  AMMONIA in the last 168 hours. Coagulation Profile:  Recent Labs Lab 04/12/17 0018 04/12/17 0340  INR 1.18 1.15   Cardiac Enzymes:  Recent Labs Lab 04/11/17 1735  TROPONINI <0.03   BNP (last 3 results) No results for input(s): PROBNP in the last 8760 hours. HbA1C: No results for input(s): HGBA1C in the last 72 hours. CBG: No results for input(s): GLUCAP in the last 168 hours. Lipid Profile: No results for input(s): CHOL, HDL, LDLCALC, TRIG, CHOLHDL, LDLDIRECT in the last 72 hours. Thyroid Function Tests: No results for input(s): TSH, T4TOTAL, FREET4, T3FREE, THYROIDAB in the last 72 hours. Anemia Panel: No results for input(s): VITAMINB12, FOLATE, FERRITIN, TIBC, IRON, RETICCTPCT in the last 72 hours. Urine analysis:  Sepsis Labs: @LABRCNTIP (procalcitonin:4,lacticidven:4)   )No results found for this or any previous visit (from the past 240 hour(s)).    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 Atherosclerotic calcification aorta. Bibasilar atelectasis. No gross acute infiltrate, pleural effusion or pneumothorax. No acute osseous findings. IMPRESSION: Enlargement of cardiac silhouette with pulmonary vascular congestion. Bibasilar atelectasis. Aortic Atherosclerosis (ICD10-I70.0). Electronically Signed   By: Ulyses Southward M.D.   On: 04/11/2017 18:02   Ct Head Wo Contrast  Result Date: 04/11/2017 CLINICAL DATA:  Patient fell with walker. EXAM: CT HEAD WITHOUT CONTRAST TECHNIQUE: Contiguous axial images were obtained from the base of the skull through the vertex without intravenous contrast. COMPARISON:  03/23/2017 and 09/10/2016  FINDINGS: BRAIN: There is mild crop sulcal and ventricular prominence consistent with superficial and central atrophy. No intraparenchymal hemorrhage, mass effect nor midline shift. Periventricular and subcortical white matter hypodensities consistent with chronic stable small vessel ischemic disease are identified. No acute large vascular territory infarcts. No abnormal extra-axial fluid collections. Basal cisterns are not effaced and midline. VASCULAR: Moderate calcific atherosclerosis of the carotid siphons. SKULL: No skull fracture. No significant scalp soft tissue swelling. Mild nonspecific patchy bony demineralization/osteopenia of the skullbase dating back to 2017. No focal lesions. SINUSES/ORBITS: The mastoid air-cells are clear. The included paranasal sinuses are well-aerated.The included ocular globes and orbital contents are non-suspicious. OTHER: None. IMPRESSION: Chronic stable cerebral atrophy and small vessel ischemic disease. No  acute intracranial abnormality. Electronically Signed   By: Tollie Ethavid  Kwon M.D.   On: 04/11/2017 18:43        Scheduled Meds: . amLODipine  5 mg Oral Daily  . atenolol  100 mg Oral Daily  . carbidopa-levodopa  1 tablet Oral 2 times per day  . carbidopa-levodopa  2 tablet Oral BID WC  . carbidopa-levodopa  2 tablet Oral Once  . darifenacin  7.5 mg Oral Daily  . DULoxetine  90 mg Oral Daily  . enoxaparin (LOVENOX) injection  40 mg Subcutaneous Q24H  . finasteride  5 mg Oral Daily  . furosemide  40 mg Intravenous BID  . hydrochlorothiazide  12.5 mg Oral Daily  . potassium chloride  20 mEq Oral BID  . sodium chloride flush  3 mL Intravenous Q12H  . tamsulosin  0.4 mg Oral Daily   Continuous Infusions:   LOS: 0 days    Time spent: 25 minutes Greater than 50% of the time spent on counseling and coordinating the care.   Manson PasseyAlma Hester Forget, MD [facility] Pager [phone]  If 7PM-7AM, please contact night-coverage [email or web address] Password  [email or web address] 04/12/2017, 11:22 AM

## 2017-04-13 ENCOUNTER — Telehealth: Payer: Self-pay | Admitting: Internal Medicine

## 2017-04-13 ENCOUNTER — Inpatient Hospital Stay (HOSPITAL_COMMUNITY): Payer: Medicare Other

## 2017-04-13 DIAGNOSIS — N400 Enlarged prostate without lower urinary tract symptoms: Secondary | ICD-10-CM

## 2017-04-13 DIAGNOSIS — I360 Nonrheumatic tricuspid (valve) stenosis: Secondary | ICD-10-CM

## 2017-04-13 LAB — BASIC METABOLIC PANEL
Anion gap: 9 (ref 5–15)
BUN: 18 mg/dL (ref 6–20)
CALCIUM: 8.8 mg/dL — AB (ref 8.9–10.3)
CO2: 31 mmol/L (ref 22–32)
Chloride: 96 mmol/L — ABNORMAL LOW (ref 101–111)
Creatinine, Ser: 0.97 mg/dL (ref 0.61–1.24)
Glucose, Bld: 142 mg/dL — ABNORMAL HIGH (ref 65–99)
Potassium: 3.9 mmol/L (ref 3.5–5.1)
SODIUM: 136 mmol/L (ref 135–145)

## 2017-04-13 LAB — ECHOCARDIOGRAM COMPLETE
HEIGHTINCHES: 72 in
Weight: 3814.4 oz

## 2017-04-13 LAB — CBC
HCT: 36.5 % — ABNORMAL LOW (ref 39.0–52.0)
Hemoglobin: 11.3 g/dL — ABNORMAL LOW (ref 13.0–17.0)
MCH: 28.7 pg (ref 26.0–34.0)
MCHC: 31 g/dL (ref 30.0–36.0)
MCV: 92.6 fL (ref 78.0–100.0)
Platelets: 235 10*3/uL (ref 150–400)
RBC: 3.94 MIL/uL — AB (ref 4.22–5.81)
RDW: 16.4 % — ABNORMAL HIGH (ref 11.5–15.5)
WBC: 5.4 10*3/uL (ref 4.0–10.5)

## 2017-04-13 LAB — PROTIME-INR
INR: 0.95
PROTHROMBIN TIME: 12.7 s (ref 11.4–15.2)

## 2017-04-13 MED ORDER — ENOXAPARIN SODIUM 120 MG/0.8ML ~~LOC~~ SOLN
1.0000 mg/kg | Freq: Two times a day (BID) | SUBCUTANEOUS | Status: DC
  Administered 2017-04-13 – 2017-04-16 (×6): 110 mg via SUBCUTANEOUS
  Filled 2017-04-13 (×8): qty 0.72

## 2017-04-13 NOTE — Progress Notes (Signed)
Progress Note  Patient Name: Garrett Stewart Date of Encounter: 04/13/2017  Primary Cardiologist: Dr Wynonia Hazard, [facility]  Subjective   SOB improving, has not ambulated extensively yet. No significant edema. 2d echo being performed.  Inpatient Medications    Scheduled Meds: . amLODipine  5 mg Oral Daily  . atenolol  100 mg Oral Daily  . carbidopa-levodopa  1 tablet Oral 2 times per day  . carbidopa-levodopa  2 tablet Oral BID WC  . carbidopa-levodopa  2 tablet Oral Once  . darifenacin  7.5 mg Oral Daily  . DULoxetine  90 mg Oral Daily  . enoxaparin (LOVENOX) injection  1 mg/kg Subcutaneous Q12H  . finasteride  5 mg Oral Daily  . furosemide  40 mg Intravenous BID  . potassium chloride  20 mEq Oral BID  . sodium chloride flush  3 mL Intravenous Q12H  . tamsulosin  0.4 mg Oral Daily   Continuous Infusions:  PRN Meds: LORazepam, methocarbamol, polyethylene glycol   Vital Signs    Vitals:   04/12/17 0659 04/12/17 1438 04/12/17 2118 04/13/17 0543  BP:  115/63 120/60 128/68  Pulse:  68 72 71  Resp:  18 18 17   Temp:  98.1 F (36.7 C) 97.9 F (36.6 C) 97.7 F (36.5 C)  TempSrc:  Oral Oral Oral  SpO2:  99% 96% 98%  Weight: 237 lb 14 oz (107.9 kg)   238 lb 6.4 oz (108.1 kg)  Height:        Intake/Output Summary (Last 24 hours) at 04/13/17 1247 Last data filed at 04/13/17 1119  Gross per 24 hour  Intake              440 ml  Output             3300 ml  Net            -2860 ml   Filed Weights   04/11/17 2332 04/12/17 0659 04/13/17 0543  Weight: 242 lb 11.6 oz (110.1 kg) 237 lb 14 oz (107.9 kg) 238 lb 6.4 oz (108.1 kg)    Telemetry    Atrial fib rate controlled - Personally Reviewed  Physical Exam   GEN: No acute distress.  HEENT: Normocephalic, atraumatic, sclera non-icteric. Neck: No JVD or bruits. Cardiac: Irregularly irregular, rate controlled, no murmurs, rubs, or gallops.  Radials/DP/PT 1+ and equal bilaterally.  Respiratory: Difficult to assess as  patient was getting echo but anteriorly clear. Breathing is unlabored. GI: Soft, rounded/protuberant, non-distended, BS +x 4. MS: no deformity. Extremities: No clubbing or cyanosis. No edema. Distal pedal pulses are 2+ and equal bilaterally. Neuro:  AAOx3. Follows commands. Psych:  Responds to questions appropriately with a normal affect.  Labs    Chemistry Recent Labs Lab 04/11/17 1735 04/12/17 0340 04/13/17 0259  NA 130* 133* 136  K 4.6 4.0 3.9  CL 97* 97* 96*  CO2 25 26 31   GLUCOSE 122* 95 142*  BUN 13 13 18   CREATININE 0.94 0.85 0.97  CALCIUM 8.9 8.7* 8.8*  PROT 7.1  --   --   ALBUMIN 3.7  --   --   AST 21  --   --   ALT <5*  --   --   ALKPHOS 89  --   --   BILITOT 1.2  --   --   GFRNONAA >60 >60 >60  GFRAA >60 >60 >60  ANIONGAP 8 10 9      Hematology Recent Labs Lab 04/11/17 1735 04/13/17 0259  WBC  6.8 5.4  RBC 3.82* 3.94*  HGB 11.1* 11.3*  HCT 34.5* 36.5*  MCV 90.3 92.6  MCH 29.1 28.7  MCHC 32.2 31.0  RDW 16.8* 16.4*  PLT 226 235    Cardiac Enzymes Recent Labs Lab 04/11/17 1735  TROPONINI <0.03   No results for input(s): TROPIPOC in the last 168 hours.   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 Atherosclerotic calcification aorta. Bibasilar atelectasis. No gross acute infiltrate, pleural effusion or pneumothorax. No acute osseous findings. IMPRESSION: Enlargement of cardiac silhouette with pulmonary vascular congestion. Bibasilar atelectasis. Aortic Atherosclerosis (ICD10-I70.0). Electronically  Signed   By: Ulyses Southward M.D.   On: 04/11/2017 18:02   Ct Head Wo Contrast  Result Date: 04/11/2017 CLINICAL DATA:  Patient fell with walker. EXAM: CT HEAD WITHOUT CONTRAST TECHNIQUE: Contiguous axial images were obtained from the base of the skull through the vertex without intravenous contrast. COMPARISON:  03/23/2017 and 09/10/2016 FINDINGS: BRAIN: There is mild crop sulcal and ventricular prominence consistent with superficial and central atrophy. No intraparenchymal hemorrhage, mass effect nor midline shift. Periventricular and subcortical white matter hypodensities consistent with chronic stable small vessel ischemic disease are identified. No acute large vascular territory infarcts. No abnormal extra-axial fluid collections. Basal cisterns are not effaced and midline. VASCULAR: Moderate calcific atherosclerosis of the carotid siphons. SKULL: No skull fracture. No significant scalp soft tissue swelling. Mild nonspecific patchy bony demineralization/osteopenia of the skullbase dating back to 2017. No focal lesions. SINUSES/ORBITS: The mastoid air-cells are clear. The included paranasal sinuses are well-aerated.The included ocular globes and orbital contents are non-suspicious. OTHER: None. IMPRESSION: Chronic stable cerebral atrophy and small vessel ischemic disease. No acute intracranial abnormality. Electronically Signed   By: Tollie Eth M.D.   On: 04/11/2017 18:43    Cardiac Studies   2d echo pending this admission  Patient Profile     82 y.o. 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 Recent echocardiogram done on 02/17/17 at [facility] showed a normal left ventricular systolic function with EF greater than 55%, mild-to-moderate mitral regurgitation and Mild to moderate tricuspid regurgitation  and elevated right ventricular systolic pressure of 50-60 mmHg.   Assessment & Plan    1. Acute on chronic diastolic heart failure: patient reports "night and day" improvement in breathing and feels better overall. Has not ambulated long distances yet though. 2D echo result pending. Dry weight not totally clear, was 231 on 4/12, 226 on 5/23, 243 on 5/31, and 239 on 04/06/17. Weight 242->238, -5.6L thus far. Renal function remaining stable. May be prudent to continue IV Lasix to see if we can get him closer to the 231 range.  2. Chronic atrial fibrillation: The patient has been treated by Dr. Wynonia Hazard of [facility] for chronic afib and on coumadin. He has been off coumadin in anticipation of prostate surgery on Friday which is now rescheduled  for 7/26. He's now on Lovenox. Long-term determination of candidacy for coumadin should be followed closely by primary cardiologist in setting of his recent fall, alcohol use and Parkinson's disease.     3. Hypertension: BP controlled.  Signed, Laurann Montanaayna N Rahi Chandonnet, PA-C  04/13/2017, 12:47 PM

## 2017-04-13 NOTE — Progress Notes (Signed)
Patient ID: Garrett Stewart, male   DOB: [birth year], 82 y.o.   MRN: [id number]  PROGRESS NOTE    Garrett Stewart  WUJ:[id number] DOB: 03-18-1935 DOA: 04/11/2017  PCP: Paulino Door, MD   Brief Narrative:  82 year old male with ITP, history of PE, chronic A fib on coumadin, Parkinson disease, hypertension, 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 Pt presented to ED with 1 week of worsening dyspnea on exertion. He has fallen about a week prior to admission, was evaluated and found to have no fractures. Since then, family has noticed his weight has been increasing (thought to be 210 lbs and now 242 lbs) and he had increased leg swelling.  In ED, pt was afebrile, RR 22 and pulse ox in 80's but this has improved to 94% with 2 L  oxygen support. CXR showed no acute findings. CT head unremarkable. Troponin was negative. EKG showed chronic a fib.   Assessment & Plan:   Principal Problem:   Acute on chronic diastolic CHF (congestive heart failure) (HCC) - Appreciate cardio following - Continue lasix 40 mg iV BID - ECHO on this admission pending - Continue daily weight and strict intake and output - Repleted electrolytes as needed  Active Problems:   Chronic atrial fibrillation (HCC) - CHADS vasc score at least 3 - Rate controlled with atenolol - Lovenox instead of coumadin as pt was sch to have TURP    Essential hypertension - Continue Norvasc and atenolol     BPH - Continue Proscar and Flomax - Continue enablex - Called [facility] to find out if we can resch procedure as pt will most likely be in hospital by this Friday and procedure TURP was sch for this Friday     Parkinson disease - Continue sinemet     Chronic ITP (idiopathic thrombocytopenia) (HCC) - Counts stable    Normocytic normochromic anemia - Stable hgb     History of pulmonary embolism - Lovenox subQ instead of coumadin as noted above     DVT prophylaxis: Lovenox subQ Code Status: full code  Family  Communication: no family at the bedside Disposition Plan: d/c once adequately diuresed    Consultants:   PT  Cardio   Procedures:   None  Antimicrobials:   None   Subjective: No overnight events.   Objective: Vitals:   04/12/17 0659 04/12/17 1438 04/12/17 2118 04/13/17 0543  BP:  115/63 120/60 128/68  Pulse:  68 72 71  Resp:  18 18 17   Temp:  98.1 F (36.7 C) 97.9 F (36.6 C) 97.7 F (36.5 C)  TempSrc:  Oral Oral Oral  SpO2:  99% 96% 98%  Weight: 107.9 kg (237 lb 14 oz)   108.1 kg (238 lb 6.4 oz)  Height:        Intake/Output Summary (Last 24 hours) at 04/13/17 1132 Last data filed at 04/13/17 1119  Gross per 24 hour  Intake              440 ml  Output             3300 ml  Net            -2860 ml   Filed Weights   04/11/17 2332 04/12/17 0659 04/13/17 0543  Weight: 110.1 kg (242 lb 11.6 oz) 107.9 kg (237 lb 14 oz) 108.1 kg (238 lb 6.4 oz)    Physical Exam  Constitutional: Appears well-developed and well-nourished. No distress.  CVS: RRR, S1/S2 + Pulmonary: Effort  and breath sounds normal, no stridor, rhonchi, wheezes, rales.  Abdominal: Soft. BS +,  (+) distended Musculoskeletal: Normal range of motion.+2 LE edema Lymphadenopathy: No lymphadenopathy noted, cervical, inguinal. Neuro: Alert. No cranial nerve deficit. Skin: Skin is warm and dry. Psychiatric: Normal mood and affect.    Data Reviewed: I have personally reviewed following labs and imaging studies  CBC:  Recent Labs Lab 04/11/17 1735 04/13/17 0259  WBC 6.8 5.4  NEUTROABS 5.0  --   HGB 11.1* 11.3*  HCT 34.5* 36.5*  MCV 90.3 92.6  PLT 226 235   Basic Metabolic Panel:  Recent Labs Lab 04/11/17 1735 04/12/17 0340 04/13/17 0259  NA 130* 133* 136  K 4.6 4.0 3.9  CL 97* 97* 96*  CO2 25 26 31   GLUCOSE 122* 95 142*  BUN 13 13 18   CREATININE 0.94 0.85 0.97  CALCIUM 8.9 8.7* 8.8*   GFR: Estimated Creatinine Clearance: 78.4 mL/min (by C-G formula based on SCr of 0.97  mg/dL). Liver Function Tests:  Recent Labs Lab 04/11/17 1735  AST 21  ALT <5*  ALKPHOS 89  BILITOT 1.2  PROT 7.1  ALBUMIN 3.7   No results for input(s): LIPASE, AMYLASE in the last 168 hours. No results for input(s): AMMONIA in the last 168 hours. Coagulation Profile:  Recent Labs Lab 04/12/17 0018 04/12/17 0340 04/13/17 0259  INR 1.18 1.15 0.95   Cardiac Enzymes:  Recent Labs Lab 04/11/17 1735  TROPONINI <0.03   BNP (last 3 results) No results for input(s): PROBNP in the last 8760 hours. HbA1C: No results for input(s): HGBA1C in the last 72 hours. CBG: No results for input(s): GLUCAP in the last 168 hours. Lipid Profile: No results for input(s): CHOL, HDL, LDLCALC, TRIG, CHOLHDL, LDLDIRECT in the last 72 hours. Thyroid Function Tests: No results for input(s): TSH, T4TOTAL, FREET4, T3FREE, THYROIDAB in the last 72 hours. Anemia Panel: No results for input(s): VITAMINB12, FOLATE, FERRITIN, TIBC, IRON, RETICCTPCT in the last 72 hours. Urine analysis:  Sepsis Labs: @LABRCNTIP (procalcitonin:4,lacticidven:4)   )No results found for this or any previous visit (from the past 240 hour(s)).    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 Atherosclerotic calcification aorta. Bibasilar atelectasis. No gross acute infiltrate, pleural effusion or pneumothorax. No acute osseous findings. IMPRESSION: Enlargement of cardiac silhouette with pulmonary vascular congestion. Bibasilar atelectasis. Aortic Atherosclerosis (ICD10-I70.0). Electronically Signed   By: Ulyses SouthwardMark  Boles M.D.   On: 04/11/2017 18:02   Ct Head Wo Contrast  Result Date:  04/11/2017 CLINICAL DATA:  Patient fell with walker. EXAM: CT HEAD WITHOUT CONTRAST TECHNIQUE: Contiguous axial images were obtained from the base of the skull through the vertex without intravenous contrast. COMPARISON:  03/23/2017 and 09/10/2016 FINDINGS: BRAIN: There is mild crop sulcal and ventricular prominence consistent with superficial and central atrophy. No intraparenchymal hemorrhage, mass effect nor midline shift. Periventricular and subcortical white matter hypodensities consistent with chronic stable small vessel ischemic disease are identified. No acute large vascular territory infarcts. No abnormal extra-axial fluid collections. Basal cisterns are not effaced and midline. VASCULAR: Moderate calcific atherosclerosis of the carotid siphons. SKULL: No skull fracture. No significant scalp soft tissue swelling. Mild nonspecific patchy bony demineralization/osteopenia of the skullbase dating back  to 2017. No focal lesions. SINUSES/ORBITS: The mastoid air-cells are clear. The included paranasal sinuses are well-aerated.The included ocular globes and orbital contents are non-suspicious. OTHER: None. IMPRESSION: Chronic stable cerebral atrophy and small vessel ischemic disease. No acute intracranial abnormality. Electronically Signed   By: Tollie Eth M.D.   On: 04/11/2017 18:43        Scheduled Meds: . amLODipine  5 mg Oral Daily  . atenolol  100 mg Oral Daily  . carbidopa-levodopa  1 tablet Oral 2 times per day  . carbidopa-levodopa  2 tablet Oral BID WC  . carbidopa-levodopa  2 tablet Oral Once  . darifenacin  7.5 mg Oral Daily  . DULoxetine  90 mg Oral Daily  . enoxaparin (LOVENOX) injection  40 mg Subcutaneous Q24H  . finasteride  5 mg Oral Daily  . furosemide  40 mg Intravenous BID  . potassium chloride  20 mEq Oral BID  . sodium chloride flush  3 mL Intravenous Q12H  . tamsulosin  0.4 mg Oral Daily   Continuous Infusions:   LOS: 1 day    Time spent: 25 minutes Greater than  50% of the time spent on counseling and coordinating the care.   Manson Passey, MD [facility] Pager [phone]  If 7PM-7AM, please contact night-coverage [email or web address] Password [facility] 04/13/2017, 11:32 AM

## 2017-04-13 NOTE — Progress Notes (Signed)
*  PRELIMINARY RESULTS* Echocardiogram 2D Echocardiogram has been performed.  Jeryl Columbialliott, Kalin Amrhein 04/13/2017, 1:14 PM

## 2017-04-13 NOTE — Telephone Encounter (Signed)
Thanks - Dr H 

## 2017-04-13 NOTE — Evaluation (Signed)
Physical Therapy Evaluation Patient Details Name: Garrett RavelRobert Stewart MRN: [id number] DOB: October 05, 1934 Today's Date: 04/13/2017   History of Present Illness  Pt is 82 y/o male admitted secondary to CHF and increasing DOE. PMH includes CHF, a fib, parkinson's disease, PE, and HTN.   Clinical Impression  Pt admitted secondary to problem above with deficits below. PTA, pt was using rollator and had multiple falls at home. Pt's significant other reports she had to help him with bathing and dressing. Upon eval, pt extremely unsafe with ambulation and not following safety cues from PT. Pt refusing to use RW appropriately and "plopping" upon sitting without seeing if stable surface behind him. Pt's significant other reports he had been doing that at home which was the source of his falls, and that rollator was slipping out from under him. Discussed use of WC, however, pt refusing. Reports his neurologist has suggested the same. Pt's significant other does not feel safe taking pt home. Recommending SNF at d/c to increase independence and safety with mobility; pt may refuse, but did not want HH services. Will continue to follow acutely to maximize functional mobility independence and safety.     Follow Up Recommendations SNF    Equipment Recommendations  Other (comment);Rolling walker with 5" wheels (bariatric RW)    Recommendations for Other Services OT consult     Precautions / Restrictions Precautions Precautions: Fall Precaution Comments: Has had multiple falls, and [facility] VERY unsafe with ambulation.  Restrictions Weight Bearing Restrictions: No      Mobility  Bed Mobility Overal bed mobility: Needs Assistance Bed Mobility: Supine to Sit;Sit to Supine     Supine to sit: Mod assist;HOB elevated Sit to supine: Min assist   General bed mobility comments: Mod A for scooting hips to EOB and for trunk elevation. Min A for LE lifting back to bed. Dependent for  scooting up in bed.   Transfers Overall transfer level: Needs assistance   Transfers: Sit to/from Stand Sit to Stand: Mod assist         General transfer comment: Mod A for lift assist and for steadying upon standing. Slouched posture upon standing, and did not stand upright despite verbal cues.   Ambulation/Gait Ambulation/Gait assistance: Min assist;Mod assist Ambulation Distance (Feet): 20 Feet Assistive device: Rolling walker (2 wheeled) Gait Pattern/deviations: Step-through pattern;Decreased stride length;Staggering left;Staggering right;Drifts right/left;Trunk flexed Gait velocity: Decreased Gait velocity interpretation: Below normal speed for age/gender General Gait Details: 20' X 2 within room. Very unsafe and unsteady gait. Decreased proximity to device and would not correct upon cues. would plop down onto bed, secondary to fatigue, and would not correct when given cues to turn completely around and reach back to surface before sitting.   Stairs            Wheelchair Mobility    Modified Rankin (Stroke Patients Only)       Balance Overall balance assessment: Needs assistance Sitting-balance support: No upper extremity supported;Feet supported Sitting balance-Leahy Scale: Fair     Standing balance support: Bilateral upper extremity supported;During functional activity Standing balance-Leahy Scale: Poor Standing balance comment: Reliant on UE and external support for balance.                              Pertinent Vitals/Pain Pain Assessment: Faces Faces Pain Scale: Hurts even more Pain Location: rib cage  Pain Descriptors / Indicators: Aching;Sore Pain Intervention(s): Limited activity within  patient's tolerance;Monitored during session;Repositioned    Home Living Family/patient expects to be discharged to:: Private residence Living Arrangements: Spouse/significant other Available Help at Discharge: Family;Available 24 hours/day Type of  Home: House Home Access: Stairs to enter Entrance Stairs-Rails: Right Entrance Stairs-Number of Steps: 5 Home Layout: Two level;Able to live on main level with bedroom/bathroom Home Equipment: Walker - 4 wheels;Shower seat;Grab bars - tub/shower;Cane - single point      Prior Function Level of Independence: Needs assistance   Gait / Transfers Assistance Needed: Have to use rollator at baseline   ADL's / Homemaking Assistance Needed: Requires assist with bathing and dressing.         Hand Dominance   Dominant Hand: Right    Extremity/Trunk Assessment   Upper Extremity Assessment Upper Extremity Assessment: Defer to OT evaluation    Lower Extremity Assessment Lower Extremity Assessment: Generalized weakness;RLE deficits/detail (Grossly 3+/5 throughout ) RLE Deficits / Details: Toe out at baseline due to foot deformities.    Cervical / Trunk Assessment Cervical / Trunk Assessment: Kyphotic  Communication   Communication: No difficulties  Cognition Arousal/Alertness: Awake/alert Behavior During Therapy: WFL for tasks assessed/performed Overall Cognitive Status: Within Functional Limits for tasks assessed                                        General Comments General comments (skin integrity, edema, etc.): Pt with very poor safety awareness. Pt seemed disinterested throughout session and PT recommendations throughout. Discussed need for transition to [facility], and pt's significant other stated the MD had told him that as well, but he refuses. Significant other tried to reason with pt throughout session and has concerns about taking pt home, as he has had multiple falls. Educated about SNF, however, pt stating I already had that. When given HH options pt stating that didn't help the last time. Will need continued safety education.     Exercises     Assessment/Plan    PT Assessment Patient needs continued PT services  PT Problem List Decreased strength;Decreased  activity tolerance;Decreased balance;Decreased mobility;Decreased coordination;Decreased knowledge of use of DME;Decreased safety awareness;Decreased knowledge of precautions;Cardiopulmonary status limiting activity       PT Treatment Interventions DME instruction;Gait training;Functional mobility training;Stair training;Therapeutic activities;Therapeutic exercise;Balance training;Neuromuscular re-education;Patient/family education    PT Goals (Current goals can be found in the Care Plan section)  Acute Rehab PT Goals Patient Stated Goal: none stated  PT Goal Formulation: With patient Time For Goal Achievement: 04/27/17 Potential to Achieve Goals: Fair    Frequency Min 2X/week   Barriers to discharge        Co-evaluation               AM-PAC PT "6 Clicks" Daily Activity  Outcome Measure Difficulty turning over in bed (including adjusting bedclothes, sheets and blankets)?: Total Difficulty moving from lying on back to sitting on the side of the bed? : Total Difficulty sitting down on and standing up from a chair with arms (e.g., wheelchair, bedside commode, etc,.)?: Total Help needed moving to and from a bed to chair (including a wheelchair)?: A Little Help needed walking in hospital room?: A Lot Help needed climbing 3-5 steps with a railing? : A Lot 6 Click Score: 10    End of Session Equipment Utilized During Treatment: Gait belt Activity Tolerance: Patient limited by fatigue Patient left: in bed;with call bell/phone within reach;with bed alarm  set;with family/visitor present Nurse Communication: Mobility status PT Visit Diagnosis: Unsteadiness on feet (R26.81);Repeated falls (R29.6);History of falling (Z91.81);Muscle weakness (generalized) (M62.81);Other symptoms and signs involving the nervous system (R29.898)    Time: 1610-9604 PT Time Calculation (min) (ACUTE ONLY): 39 min   Charges:   PT Evaluation $PT Eval Moderate Complexity: 1 Procedure PT Treatments $Gait  Training: 8-22 mins   PT G Codes:        Gladys Damme, PT, DPT  Acute Rehabilitation Services  Pager: [phone]   Lehman Prom 04/13/2017, 5:06 PM

## 2017-04-13 NOTE — Telephone Encounter (Signed)
Returned call to friend (ok per DPR)-wanted to call to inform Dr. Rennis GoldenHilty that the first available date for procedure (TURP) is July 26th.  Friend was worried she would miss rounding MD this morning as patient is admitted.    Advised I would route to Dr. Rennis GoldenHilty to make aware.

## 2017-04-13 NOTE — Telephone Encounter (Signed)
New Message   Per pt significant other was instructed by Dr. Rennis GoldenHilty who is seeing pt in hospital to call in with information regarding patient. She states that soonest appt available to get Terp would be July 26.

## 2017-04-13 NOTE — Progress Notes (Signed)
ANTICOAGULATION CONSULT NOTE - Initial Consult  Pharmacy Consult for Lovenox Indication: atrial fibrillation and hx pulmonary embolus  Allergies  Allergen Reactions  . Statins Other (See Comments)    Reaction unknown to patient    Patient Measurements: Height: 6' (182.9 cm) Weight: 238 lb 6.4 oz (108.1 kg) IBW/kg (Calculated) : 77.6  Vital Signs: Temp: 97.7 F (36.5 C) (07/19 0543) Temp Source: Oral (07/19 0543) BP: 128/68 (07/19 0543) Pulse Rate: 71 (07/19 0543)  Labs:  Recent Labs  04/11/17 1735 04/12/17 0018 04/12/17 0340 04/13/17 0259  HGB 11.1*  --   --  11.3*  HCT 34.5*  --   --  36.5*  PLT 226  --   --  235  LABPROT  --  15.0 14.8 12.7  INR  --  1.18 1.15 0.95  CREATININE 0.94  --  0.85 0.97  TROPONINI <0.03  --   --   --     Estimated Creatinine Clearance: 78.4 mL/min (by C-G formula based on SCr of 0.97 mg/dL).   Medical History: Past Medical History:  Diagnosis Date  . A-fib (HCC)   . Anxiety   . Arthritis    "mainly in my back" (04/11/2017)  . BPH (benign prostatic hyperplasia)   . CHF (congestive heart failure), NYHA class I, acute on chronic, combined (HCC) 04/11/2017  . Chronic ITP (idiopathic thrombocytopenic purpura) (HCC)   . Chronic lower back pain   . Daily headache    "from Carbidopa" (04/11/2017)  . Depression   . High cholesterol    "I quit taking my RX; I'm allergic to all them" (04/11/2017)  . History of gout   . Hypertension   . Parkinson's disease (HCC)   . Pneumonia 08/2016  . Pulmonary embolism (HCC) ~ 06/2013  . Stroke [facility](HCC)    "I've had 2; caused my right foot to fan out" (04/11/2017)    Medications:  Scheduled:  . amLODipine  5 mg Oral Daily  . atenolol  100 mg Oral Daily  . carbidopa-levodopa  1 tablet Oral 2 times per day  . carbidopa-levodopa  2 tablet Oral BID WC  . carbidopa-levodopa  2 tablet Oral Once  . darifenacin  7.5 mg Oral Daily  . DULoxetine  90 mg Oral Daily  . enoxaparin (LOVENOX) injection  1  mg/kg Subcutaneous Q12H  . finasteride  5 mg Oral Daily  . furosemide  40 mg Intravenous BID  . potassium chloride  20 mEq Oral BID  . sodium chloride flush  3 mL Intravenous Q12H  . tamsulosin  0.4 mg Oral Daily   Infusions:    Assessment: 82 yo M admitted with fatigue, progressive dyspnea x 1 week, and 30# weight gain.  Pt on Coumadin PTA however this was being held with plan for TURP procedure 7/20.  This procedure will now be delayed due to this admission.  Pharmacy asked to start treatment dose Lovenox in the interim.  Pt received Lovenox 40mg  today at 1000.  Goal of Therapy:  Anti-Xa level 0.6-1 units/ml 4hrs after LMWH dose given Monitor platelets by anticoagulation protocol: Yes   Plan:  Increase Lovenox to 110mg  SQ q12h - first dose at 1800. Monitor CBC  Toys 'R' UsKimberly Shubham Thackston, Pharm.D., BCPS Clinical Pharmacist Pager: [phone] Clinical phone for 04/13/2017 from 8:30-4:00 is [phone]. After 4pm, please call Main Rx [phone]) for assistance. 04/13/2017 12:17 PM

## 2017-04-14 DIAGNOSIS — I5031 Acute diastolic (congestive) heart failure: Secondary | ICD-10-CM

## 2017-04-14 LAB — BASIC METABOLIC PANEL
Anion gap: 7 (ref 5–15)
BUN: 15 mg/dL (ref 6–20)
CHLORIDE: 94 mmol/L — AB (ref 101–111)
CO2: 34 mmol/L — AB (ref 22–32)
CREATININE: 0.95 mg/dL (ref 0.61–1.24)
Calcium: 9.1 mg/dL (ref 8.9–10.3)
GFR calc Af Amer: >60 mL/min (ref >60)
GFR calc non Af Amer: >60 mL/min (ref >60)
GLUCOSE: 115 mg/dL — AB (ref 65–99)
Potassium: 4 mmol/L (ref 3.5–5.1)
Sodium: 135 mmol/L (ref 135–145)

## 2017-04-14 LAB — CBC
HCT: 37.7 % — ABNORMAL LOW (ref 39.0–52.0)
Hemoglobin: 11.7 g/dL — ABNORMAL LOW (ref 13.0–17.0)
MCH: 28.6 pg (ref 26.0–34.0)
MCHC: 31 g/dL (ref 30.0–36.0)
MCV: 92.2 fL (ref 78.0–100.0)
PLATELETS: 269 10*3/uL (ref 150–400)
RBC: 4.09 MIL/uL — ABNORMAL LOW (ref 4.22–5.81)
RDW: 16 % — AB (ref 11.5–15.5)
WBC: 5.4 10*3/uL (ref 4.0–10.5)

## 2017-04-14 NOTE — Evaluation (Signed)
Occupational Therapy Evaluation Patient Details Name: Garrett RavelRobert Stewart MRN: [id number] DOB: Apr 10, 1935 Today's Date: 04/14/2017    History of Present Illness Pt is 82 y/o male admitted secondary to CHF and increasing DOE. PMH includes CHF, a fib, parkinson's disease, PE, and HTN.    Clinical Impression   Pt is assisted for bathing and dressing and walks with a rollator. He has a hx of multiple falls and demonstrated impulsivity and decreased awareness of safety. Pt has poor activity tolerance. Will follow acutely.    Follow Up Recommendations  SNF;Supervision/Assistance - 24 hour (HHOT if pt refuses SNF)   Equipment Recommendations  None recommended by OT    Recommendations for Other Services       Precautions / Restrictions Precautions Precautions: Fall Precaution Comments: multiple falls, very unsafe Restrictions Weight Bearing Restrictions: No      Mobility Bed Mobility               General bed mobility comments: received in chair  Transfers Overall transfer level: Needs assistance Equipment used: Rolling walker (2 wheeled) Transfers: Sit to/from Stand Sit to Stand: Min assist         General transfer comment: use of momentum and multiple trials to stand    Balance Overall balance assessment: Needs assistance   Sitting balance-Leahy Scale: Fair     Standing balance support: Bilateral upper extremity supported;During functional activity Standing balance-Leahy Scale: Poor                             ADL either performed or assessed with clinical judgement   ADL Overall ADL's : At baseline                                       General ADL Comments: Pt likely at his baseline.     Vision         Perception     Praxis      Pertinent Vitals/Pain Pain Assessment: No/denies pain     Hand Dominance Right   Extremity/Trunk Assessment Upper Extremity Assessment Upper Extremity Assessment: Overall WFL for tasks  assessed   Lower Extremity Assessment Lower Extremity Assessment: Defer to PT evaluation   Cervical / Trunk Assessment Cervical / Trunk Assessment: Kyphotic   Communication Communication Communication: No difficulties   Cognition Arousal/Alertness: Awake/alert Behavior During Therapy: Impulsive Overall Cognitive Status: Impaired/Different from baseline Area of Impairment: Safety/judgement                         Safety/Judgement: Decreased awareness of safety;Decreased awareness of deficits         General Comments       Exercises     Shoulder Instructions      Home Living Family/patient expects to be discharged to:: Private residence Living Arrangements: Spouse/significant other Available Help at Discharge: Family;Available 24 hours/day Type of Home: House Home Access: Stairs to enter Entergy CorporationEntrance Stairs-Number of Steps: 5 Entrance Stairs-Rails: Right Home Layout: Two level;Able to live on main level with bedroom/bathroom Alternate Level Stairs-Number of Steps: 14 Alternate Level Stairs-Rails: Left Bathroom Shower/Tub: Chief Strategy OfficerTub/shower unit   Bathroom Toilet: Standard     Home Equipment: Environmental consultantWalker - 4 wheels;Shower seat;Grab bars - tub/shower;Cane - single point;Adaptive equipment Adaptive Equipment: Reacher;Sock aid;Long-handled shoe horn;Long-handled sponge        Prior Functioning/Environment Level of Independence:  Needs assistance  Gait / Transfers Assistance Needed: uses rollator at baseline, many falls ADL's / Homemaking Assistance Needed: assisted for LB bathing and dressing and all IADL, uses a reacher, but not likely for LB ADL            OT Problem List: Decreased strength;Decreased activity tolerance;Impaired balance (sitting and/or standing);Decreased safety awareness;Decreased knowledge of use of DME or AE;Obesity;Decreased cognition      OT Treatment/Interventions: Self-care/ADL training;DME and/or AE instruction;Therapeutic  activities;Patient/family education;Balance training;Energy conservation    OT Goals(Current goals can be found in the care plan section) Acute Rehab OT Goals Patient Stated Goal: none stated  OT Goal Formulation: With patient Time For Goal Achievement: 04/28/17 Potential to Achieve Goals: Fair ADL Goals Pt Will Perform Grooming: with min guard assist;standing Pt Will Transfer to Toilet: with min guard assist;ambulating;bedside commode Pt Will Perform Toileting - Clothing Manipulation and hygiene: with min guard assist;sit to/from stand  OT Frequency: Min 2X/week   Barriers to D/C:            Co-evaluation              AM-PAC PT "6 Clicks" Daily Activity     Outcome Measure Help from another person eating meals?: None Help from another person taking care of personal grooming?: A Little Help from another person toileting, which includes using toliet, bedpan, or urinal?: A Lot Help from another person bathing (including washing, rinsing, drying)?: A Lot Help from another person to put on and taking off regular upper body clothing?: A Little Help from another person to put on and taking off regular lower body clothing?: A Little 6 Click Score: 17   End of Session Equipment Utilized During Treatment: Gait belt;Rolling walker  Activity Tolerance: Patient limited by fatigue Patient left: in chair;with call bell/phone within reach;with chair alarm set;with nursing/sitter in room  OT Visit Diagnosis: Unsteadiness on feet (R26.81);Muscle weakness (generalized) (M62.81);History of falling (Z91.81);Other symptoms and signs involving cognitive function;Other abnormalities of gait and mobility (R26.89)                Time: 1610-9604 OT Time Calculation (min): 27 min Charges:  OT General Charges $OT Visit: 1 Procedure OT Evaluation $OT Eval Moderate Complexity: 1 Procedure OT Treatments $Self Care/Home Management : 8-22 mins G-Codes:       Evern Bio 04/14/2017,  10:39 AM  [phone]

## 2017-04-14 NOTE — Progress Notes (Addendum)
Patient ID: Garrett Stewart, male   DOB: 1935/05/11, 82 y.o.   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 Pt presented to ED with 1 week of worsening dyspnea on exertion. He has fallen about a week prior to admission, was evaluated and found to have no fractures. Since then, family has noticed his weight has been increasing (thought to be 210 lbs and now 242 lbs) and he had increased leg swelling.  In ED, pt was afebrile, RR 22 and pulse ox in 80's but this has improved to 94% with 2 L Sunflower oxygen support. CXR showed no acute findings. CT head unremarkable. Troponin was negative. EKG showed chronic a fib.   Assessment & Plan:   Principal Problem:   Acute on chronic diastolic CHF (congestive heart failure) (HCC) - Continue lasix per cardio recommendations  - ECHO showed preserved EF - Continue daily weight and strict intake and output - Weight since admission 107.9 kg --> 106.4 kg    Active Problems:   Chronic atrial fibrillation (HCC) - CHADS vasc score at least 3 - Continue atenolol for rate control - Coumadin on hold, TURP likely will be done in less than 2 week period; pt on Lovenox     Essential hypertension - Continue Norvasc and atenolol     BPH - Continue Proscar and Flomax - Continue enablex - Pt was supposed to have TURP in [place] ([place]), I called their office yesterday, canceled the procedure and admin staff to reach caregiver to resch procedure     Parkinson disease - Continue sinemet    Chronic ITP (idiopathic thrombocytopenia) (HCC) - Stable     Normocytic normochromic anemia - Stable hgb    History of pulmonary embolism - Continue Lovenox subQ    Anxiety and depression - On Cymbalta and ativan  PRN    DVT prophylaxis: Lovenox subQ Code Status: full code  Family Communication: called pt significant other over the phone this am, no answer Disposition Plan: to SNF once cleared by cardio    Consultants:   PT  Cardio   Procedures:   ECHO 7/19 -mild LVH, normal EF  Antimicrobials:   None    Subjective: No overnight events.  Objective: Vitals:   04/13/17 1317 04/13/17 2200 04/14/17 0420 04/14/17 0500  BP: (!) 111/59 136/75 130/70   Pulse: 71 84 80   Resp: 19 18 17    Temp: (!) 97.5 F (36.4 C) 98 F (36.7 C) (!) 96.1 F (35.6 C) 99.1 F (37.3 C)  TempSrc: Oral Oral Axillary Oral  SpO2: (!) 74% 99% 98%   Weight:   106.4 kg (234 lb 8 oz)   Height:        Intake/Output Summary (Last 24 hours) at 04/14/17 0910 Last data filed at 04/14/17 0837  Gross per 24 hour  Intake              500 ml  Output             2800 ml  Net            -2300 ml   Filed Weights   04/12/17 0659 04/13/17 0543 04/14/17 0420  Weight: 107.9 kg (237 lb 14 oz) 108.1 kg (238 lb 6.4 oz) 106.4  kg (234 lb 8 oz)    Examination:  General exam: Appears calm and comfortable  Respiratory system: Clear to auscultation. Respiratory effort normal. Cardiovascular system: S1 & S2 heard, Rate controlled  Gastrointestinal system: Abdomen is nondistended, soft and nontender. No organomegaly or masses felt. Normal bowel sounds heard. Central nervous system: confused, no focal deficits . Extremities: Symmetric 5 x 5 power. Skin: No rashes, lesions or ulcers Psychiatry: Normal mood and behavior   Data Reviewed: I have personally reviewed following labs and imaging studies  CBC:  Recent Labs Lab 04/11/17 1735 04/13/17 0259 04/14/17 0437  WBC 6.8 5.4 5.4  NEUTROABS 5.0  --   --   HGB 11.1* 11.3* 11.7*  HCT 34.5* 36.5* 37.7*  MCV 90.3 92.6 92.2  PLT 226 235 269   Basic Metabolic Panel:  Recent Labs Lab 04/11/17 1735 04/12/17 0340 04/13/17 0259 04/14/17 0437  NA 130* 133* 136  135  K 4.6 4.0 3.9 4.0  CL 97* 97* 96* 94*  CO2 25 26 31  34*  GLUCOSE 122* 95 142* 115*  BUN 13 13 18 15   CREATININE 0.94 0.85 0.97 0.95  CALCIUM 8.9 8.7* 8.8* 9.1   GFR: Estimated Creatinine Clearance: 79.5 mL/min (by C-G formula based on SCr of 0.95 mg/dL). Liver Function Tests:  Recent Labs Lab 04/11/17 1735  AST 21  ALT <5*  ALKPHOS 89  BILITOT 1.2  PROT 7.1  ALBUMIN 3.7   No results for input(s): LIPASE, AMYLASE in the last 168 hours. No results for input(s): AMMONIA in the last 168 hours. Coagulation Profile:  Recent Labs Lab 04/12/17 0018 04/12/17 0340 04/13/17 0259  INR 1.18 1.15 0.95   Cardiac Enzymes:  Recent Labs Lab 04/11/17 1735  TROPONINI <0.03   BNP (last 3 results) No results for input(s): PROBNP in the last 8760 hours. HbA1C: No results for input(s): HGBA1C in the last 72 hours. CBG: No results for input(s): GLUCAP in the last 168 hours. Lipid Profile: No results for input(s): CHOL, HDL, LDLCALC, TRIG, CHOLHDL, LDLDIRECT in the last 72 hours. Thyroid Function Tests: No results for input(s): TSH, T4TOTAL, FREET4, T3FREE, THYROIDAB in the last 72 hours. Anemia Panel: No results for input(s): VITAMINB12, FOLATE, FERRITIN, TIBC, IRON, RETICCTPCT in the last 72 hours. Urine analysis:    Component Value Date/Time   COLORURINE YELLOW 03/23/2017 1328   APPEARANCEUR CLEAR 03/23/2017 1328   LABSPEC 1.015 03/23/2017 1328   PHURINE 5.0 03/23/2017 1328   GLUCOSEU NEGATIVE 03/23/2017 1328   HGBUR NEGATIVE 03/23/2017 1328   BILIRUBINUR NEGATIVE 03/23/2017 1328   KETONESUR NEGATIVE 03/23/2017 1328   PROTEINUR NEGATIVE 03/23/2017 1328   NITRITE NEGATIVE 03/23/2017 1328   LEUKOCYTESUR NEGATIVE 03/23/2017 1328   Sepsis Labs: @LABRCNTIP (procalcitonin:4,lacticidven:4)   )No results found for this or any previous visit (from the past 240 hour(s)).    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 Atherosclerotic calcification aorta. Bibasilar atelectasis. No gross acute infiltrate, pleural effusion or pneumothorax. No acute osseous findings. IMPRESSION: Enlargement of cardiac silhouette with pulmonary vascular congestion. Bibasilar atelectasis. Aortic Atherosclerosis (ICD10-I70.0). Electronically Signed   By: Ulyses SouthwardMark  Boles M.D.   On: 04/11/2017 18:02   Ct Head Wo Contrast  Result Date: 04/11/2017  CLINICAL DATA:  Patient fell with walker. EXAM: CT HEAD WITHOUT CONTRAST TECHNIQUE: Contiguous axial images were obtained from the base of the skull through the vertex without intravenous contrast. COMPARISON:  03/23/2017 and 09/10/2016 FINDINGS: BRAIN: There is mild crop sulcal and ventricular prominence consistent with superficial and central atrophy. No intraparenchymal hemorrhage, mass effect nor midline shift. Periventricular and subcortical white matter hypodensities consistent with chronic stable small vessel ischemic disease are identified. No acute large vascular territory infarcts. No abnormal extra-axial fluid collections. Basal cisterns are not effaced and midline. VASCULAR: Moderate calcific atherosclerosis of the carotid siphons. SKULL: No skull fracture. No significant scalp soft tissue swelling. Mild nonspecific patchy bony demineralization/osteopenia of the skullbase dating back to 2017. No focal lesions. SINUSES/ORBITS: The mastoid air-cells are clear. The included paranasal sinuses are well-aerated.The included ocular globes and orbital contents are non-suspicious. OTHER: None. IMPRESSION: Chronic stable cerebral atrophy and small vessel ischemic disease. No acute intracranial abnormality.  Electronically Signed   By: Tollie Eth M.D.   On: 04/11/2017 18:43        Scheduled Meds: . amLODipine  5 mg Oral Daily  . atenolol  100 mg Oral Daily  . carbidopa-levodopa  1 tablet Oral 2 times per day  . carbidopa-levodopa  2 tablet Oral BID WC  . carbidopa-levodopa  2 tablet Oral Once  . darifenacin  7.5 mg Oral Daily  . DULoxetine  90 mg Oral Daily  . enoxaparin (LOVENOX) injection  1 mg/kg Subcutaneous Q12H  . finasteride  5 mg Oral Daily  . furosemide  40 mg Intravenous BID  . potassium chloride  20 mEq Oral BID  . sodium chloride flush  3 mL Intravenous Q12H  . tamsulosin  0.4 mg Oral Daily   Continuous Infusions:   LOS: 2 days    Time spent: 25 minutes  Greater than 50% of the time spent on counseling and coordinating the care.   Manson Passey, MD [facility] Pager [phone]  If 7PM-7AM, please contact night-coverage [email or web address] Password [email or web address] 04/14/2017, 9:10 AM

## 2017-04-14 NOTE — Progress Notes (Signed)
Progress Note  Patient Name: Garrett Stewart Date of Encounter: 04/14/2017  Primary Cardiologist: Dr Novella Rob, [place]  Subjective   SOB improved/resolved.No sig LEE. Very weak per PT, SNF recommended.  Inpatient Medications    Scheduled Meds: . amLODipine  5 mg Oral Daily  . atenolol  100 mg Oral Daily  . carbidopa-levodopa  1 tablet Oral 2 times per day  . carbidopa-levodopa  2 tablet Oral BID WC  . carbidopa-levodopa  2 tablet Oral Once  . darifenacin  7.5 mg Oral Daily  . DULoxetine  90 mg Oral Daily  . enoxaparin (LOVENOX) injection  1 mg/kg Subcutaneous Q12H  . finasteride  5 mg Oral Daily  . furosemide  40 mg Intravenous BID  . potassium chloride  20 mEq Oral BID  . sodium chloride flush  3 mL Intravenous Q12H  . tamsulosin  0.4 mg Oral Daily   Continuous Infusions:  PRN Meds: LORazepam, methocarbamol, polyethylene glycol   Vital Signs    Vitals:   04/13/17 2200 04/14/17 0420 04/14/17 0500 04/14/17 1000  BP: 136/75 130/70  132/72  Pulse: 84 80  91  Resp: 18 17    Temp: 98 F (36.7 C) (!) 96.1 F (35.6 C) 99.1 F (37.3 C) 98.2 F (36.8 C)  TempSrc: Oral Axillary Oral Oral  SpO2: 99% 98%  96%  Weight:  234 lb 8 oz (106.4 kg)    Height:        Intake/Output Summary (Last 24 hours) at 04/14/17 1135 Last data filed at 04/14/17 0837  Gross per 24 hour  Intake              300 ml  Output             1550 ml  Net            -1250 ml   Filed Weights   04/12/17 0659 04/13/17 0543 04/14/17 0420  Weight: 237 lb 14 oz (107.9 kg) 238 lb 6.4 oz (108.1 kg) 234 lb 8 oz (106.4 kg)    Telemetry    Chronic atrial fib rate controlled - Personally Reviewed  Physical Exam   GEN: No acute distress.  HEENT: Normocephalic, atraumatic, sclera non-icteric. Neck: No JVD or bruits. Cardiac: Irregularly irregular no murmurs, rubs, or gallops.  Radials/DP/PT 1+ and equal bilaterally.  Respiratory: Clear to auscultation bilaterally. Breathing is unlabored. GI:  Soft, nontender, rounded, non-distended, BS +x 4. MS: no deformity. Extremities: No clubbing or cyanosis. No edema. Distal pedal pulses are 2+ and equal bilaterally. Neuro:  AAOx3. Follows commands. Psych:  Responds to questions appropriately with a normal affect.  Labs    Chemistry Recent Labs Lab 04/11/17 1735 04/12/17 0340 04/13/17 0259 04/14/17 0437  NA 130* 133* 136 135  K 4.6 4.0 3.9 4.0  CL 97* 97* 96* 94*  CO2 _0 34*  GLUCOSE 122* 95 142* 115*  BUN _1 CREATININE 0.94 0.85 0.97 0.95  CALCIUM 8.9 8.7* 8.8* 9.1  PROT 7.1  --   --   --   ALBUMIN 3.7  --   --   --   AST 21  --   --   --   ALT <5*  --   --   --   ALKPHOS 89  --   --   --   BILITOT 1.2  --   --   --   GFRNONAA >60 >60 >60 >60  GFRAA >60 >60 >60 >60  ANIONGAP _0 Hematology Recent Labs Lab 04/11/17 1735 04/13/17 0259 04/14/17 0437  WBC 6.8 5.4 5.4  RBC 3.82* 3.94* 4.09*  HGB 11.1* 11.3* 11.7*  HCT 34.5* 36.5* 37.7*  MCV 90.3 92.6 92.2  MCH 29.1 28.7 28.6  MCHC 32.2 31.0 31.0  RDW 16.8* 16.4* 16.0*  PLT 226 235 269    Cardiac Enzymes Recent Labs Lab 04/11/17 1735  TROPONINI <0.03   No results for input(s): TROPIPOC in the last 168 hours.   BNP Recent Labs Lab 04/11/17 1712  BNP 281.5*     DDimer  Recent Labs Lab 04/11/17 1735  DDIMER 0.77*     Radiology    No results found.  Cardiac Studies   2D Echo 04/13/17 Study Conclusions  - Left ventricle: The cavity size was normal. Wall thickness was   increased in a pattern of mild LVH. Systolic function was   vigorous. The estimated ejection fraction was in the range of 65%   to 70%. Wall motion was normal; there were no regional wall   motion abnormalities. The study is not technically sufficient to   allow evaluation of LV diastolic function. - Aortic valve: Trileaflet. Sclerosis without stenosis. There was   no regurgitation. - Mitral valve: Calcified annulus. Mildly thickened leaflets .    There was moderate, eccentric regurgitation. - Left atrium: Moderately dilated. - Right atrium: Moderately dilated. - Tricuspid valve: There was mild regurgitation. - Pulmonary arteries: PA peak pressure: 42 mm Hg (S). - Inferior vena cava: The vessel was dilated. The respirophasic   diameter changes were blunted (< 50%), consistent with elevated   central venous pressure. Impressions: - LVEF 65-70%, mild LVH, normal wall motion, aortic valve   sclerosis, MAC with moderate eccentric MR, moderate biatrial   enlargement, mild TR, RVSP 42 mmHg, dilated IVC  Patient Profile     82 y.o. 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 Recent echocardiogram done on 02/17/17 at [facility] showed a normal left ventricular systolic function with EF greater than 55%, mild-to-moderate mitral regurgitation and Mild to moderate tricuspid regurgitation and elevated right ventricular systolic pressure of 35-57 mmHg.   Assessment & Plan    1. Acute on chronic diastolic heart failure:diuresed from 242->234 thus far, reports significant improvement in dyspnea, feels back to baseline. 2D echo with normal LV, mild LVH, moderate MR which will need to be followed. Dry weight not totally clear, was 231 on 4/12, 226 on 5/23, 243 on 5/31, and 239 on 04/06/17. He is -6.9 L. Renal function remaining stable, CO beginning to bump. Will discuss changing to oral diuretic this PM versus tomorrow.  2. Chronic atrial fibrillation: The patient has been treated by Dr. Novella Rob of [facility] for chronic afiband on coumadin. He has been off coumadin in anticipation of prostate surgery on Friday which is now rescheduled for 7/26. He's now on Lovenox. Long-term determination of candidacy for coumadin should be followed closely by  primary cardiologist in setting of his recent fall, alcohol use and Parkinson's disease.  3. Hypertension:BP remains controlled.  Signed, Charlie Pitter, PA-C  04/14/2017, 11:35 AM

## 2017-04-14 NOTE — Progress Notes (Signed)
Patient is currently refusing SNF, but he agreed for CSW to send out referral. He would like to go home at discharge. His significant other in the room would like him to go to SNF but he became angry at her and said he'd been to SNF too many times. Please consult CSW if patient changes his mind.   Garrett Stewart LCSWA [phone]

## 2017-04-14 NOTE — NC FL2 (Signed)
[place] MEDICAID FL2 LEVEL OF CARE SCREENING TOOL     IDENTIFICATION  Patient Name: Garrett Stewart Birthdate: [birth year] Sex: male Admission Date (Current Location): 04/11/2017  [facility] and [place] Number:  Producer, television/film/video and Address:  The . [facility], [address], [place], [place] [address]      Provider Number: [id number]  Attending Physician Name and Address:  Alison Murray, MD  Relative Name and Phone Number:       Current Level of Care: Hospital Recommended Level of Care: Skilled Nursing Facility Prior Approval Number:    Date Approved/Denied:   PASRR Number: [id number]  Discharge Plan: SNF    Current Diagnoses: Patient Active Problem List   Diagnosis Date Noted  . Acute CHF (congestive heart failure) (HCC) 04/12/2017  . Acute on chronic diastolic CHF (congestive heart failure) (HCC) 04/11/2017  . Hyponatremia 04/11/2017  . Chronic atrial fibrillation (HCC) 11/30/2016  . Chronic ITP (idiopathic thrombocytopenia) (HCC) 11/30/2016  . Essential hypertension 11/30/2016  . Parkinson's disease (HCC) 11/30/2016  . Normocytic normochromic anemia 11/30/2016  . History of pulmonary embolism 11/30/2016  . Benign prostatic hyperplasia without urinary obstruction 01/29/2013    Orientation RESPIRATION BLADDER Height & Weight     Self, Time, Situation, Place  Normal Continent Weight: 106.4 kg (234 lb 8 oz) Height:  6' (182.9 cm)  BEHAVIORAL SYMPTOMS/MOOD NEUROLOGICAL BOWEL NUTRITION STATUS      Continent Diet (Please see DC Summary)  AMBULATORY STATUS COMMUNICATION OF NEEDS Skin   Limited Assist Verbally Normal                       Personal Care Assistance Level of Assistance  Bathing, Feeding, Dressing Bathing Assistance: Limited assistance Feeding assistance: Independent Dressing Assistance: Limited assistance     Functional Limitations Info             SPECIAL CARE FACTORS FREQUENCY  PT (By licensed PT), OT (By  licensed OT)     PT Frequency: 5x/week OT Frequency: 3x/week            Contractures      Additional Factors Info  Code Status, Allergies, Psychotropic Code Status Info: Full Allergies Info: Statins Psychotropic Info: Cymbalta         Current Medications (04/14/2017):  This is the current hospital active medication list Current Facility-Administered Medications  Medication Dose Route Frequency Provider Last Rate Last Dose  . amLODipine (NORVASC) tablet 5 mg  5 mg Oral Daily Danford, Earl Lites, MD   5 mg at 04/14/17 1002  . atenolol (TENORMIN) tablet 100 mg  100 mg Oral Daily Danford, Earl Lites, MD   100 mg at 04/14/17 1002  . carbidopa-levodopa (SINEMET IR) 25-100 MG per tablet immediate release 1 tablet  1 tablet Oral 2 times per day Alberteen Sam, MD   1 tablet at 04/14/17 1255  . carbidopa-levodopa (SINEMET IR) 25-100 MG per tablet immediate release 2 tablet  2 tablet Oral BID WC Danford, Earl Lites, MD   2 tablet at 04/14/17 1002  . carbidopa-levodopa (SINEMET IR) 25-100 MG per tablet immediate release 2 tablet  2 tablet Oral Once Danford, Earl Lites, MD      . darifenacin (ENABLEX) 24 hr tablet 7.5 mg  7.5 mg Oral Daily Danford, Earl Lites, MD   7.5 mg at 04/14/17 1010  . DULoxetine (CYMBALTA) DR capsule 90 mg  90 mg Oral Daily Alberteen Sam, MD   90 mg at 04/14/17  1002  . enoxaparin (LOVENOX) injection 110 mg  1 mg/kg Subcutaneous Q12H Hammons, Kimberly B, RPH   110 mg at 04/14/17 0519  . finasteride (PROSCAR) tablet 5 mg  5 mg Oral Daily Danford, Earl Liteshristopher P, MD   5 mg at 04/14/17 1002  . furosemide (LASIX) injection 40 mg  40 mg Intravenous BID Alberteen Samanford, Christopher P, MD   40 mg at 04/14/17 1256  . LORazepam (ATIVAN) tablet 0.5 mg  0.5 mg Oral Daily PRN Danford, Earl Liteshristopher P, MD      . methocarbamol (ROBAXIN) tablet 750 mg  750 mg Oral QID PRN Danford, Earl Liteshristopher P, MD      . polyethylene glycol (MIRALAX / GLYCOLAX) packet 17 g  17 g  Oral Daily PRN Alberteen Samanford, Christopher P, MD   17 g at 04/14/17 1002  . potassium chloride SA (K-DUR,KLOR-CON) CR tablet 20 mEq  20 mEq Oral BID Alberteen Samanford, Christopher P, MD   20 mEq at 04/14/17 1002  . sodium chloride flush (NS) 0.9 % injection 3 mL  3 mL Intravenous Q12H Danford, Earl Liteshristopher P, MD   3 mL at 04/14/17 1000  . tamsulosin (FLOMAX) capsule 0.4 mg  0.4 mg Oral Daily Danford, Earl Liteshristopher P, MD   0.4 mg at 04/14/17 1002     Discharge Medications: Please see discharge summary for a list of discharge medications.  Relevant Imaging Results:  Relevant Lab Results:   Additional Information SSN: [id number]  Mearl Latinadia S Truda Staub, LCSWA

## 2017-04-15 LAB — CBC
HEMATOCRIT: 38.7 % — AB (ref 39.0–52.0)
Hemoglobin: 11.9 g/dL — ABNORMAL LOW (ref 13.0–17.0)
MCH: 28.2 pg (ref 26.0–34.0)
MCHC: 30.7 g/dL (ref 30.0–36.0)
MCV: 91.7 fL (ref 78.0–100.0)
Platelets: 271 10*3/uL (ref 150–400)
RBC: 4.22 MIL/uL (ref 4.22–5.81)
RDW: 15.9 % — ABNORMAL HIGH (ref 11.5–15.5)
WBC: 5.6 10*3/uL (ref 4.0–10.5)

## 2017-04-15 LAB — BASIC METABOLIC PANEL
Anion gap: 8 (ref 5–15)
BUN: 18 mg/dL (ref 6–20)
CHLORIDE: 93 mmol/L — AB (ref 101–111)
CO2: 33 mmol/L — AB (ref 22–32)
CREATININE: 0.87 mg/dL (ref 0.61–1.24)
Calcium: 9.4 mg/dL (ref 8.9–10.3)
GFR calc Af Amer: >60 mL/min (ref >60)
GFR calc non Af Amer: >60 mL/min (ref >60)
GLUCOSE: 119 mg/dL — AB (ref 65–99)
Potassium: 4.1 mmol/L (ref 3.5–5.1)
SODIUM: 134 mmol/L — AB (ref 135–145)

## 2017-04-15 MED ORDER — FUROSEMIDE 40 MG PO TABS
40.0000 mg | ORAL_TABLET | Freq: Two times a day (BID) | ORAL | Status: DC
  Administered 2017-04-15 – 2017-04-16 (×2): 40 mg via ORAL
  Filled 2017-04-15 (×2): qty 1

## 2017-04-15 NOTE — Progress Notes (Addendum)
Patient ID: Garrett RavelRobert Leeson, male   DOB: 29-May-1935, 82 y.o.   MRN: [id number]  PROGRESS NOTE    Garrett RavelRobert Tenpenny  JYN:[id number] DOB: 29-May-1935 DOA: 04/11/2017  PCP: Paulino DoorWeiser, Mark, MD   Brief Narrative:  82 year old male with ITP, history of PE, chronic A fib on coumadin, Parkinson disease, hypertension, 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 Pt presented to ED with 1 week of worsening dyspnea on exertion. He has fallen about a week prior to admission, was evaluated and found to have no fractures. Since then, family has noticed his weight has been increasing (thought to be 210 lbs and now 242 lbs) and he had increased leg swelling.  In ED, pt was afebrile, RR 22 and pulse ox in 80's but this has improved to 94% with 2 L Hart oxygen support. CXR showed no acute findings. CT head unremarkable. Troponin was negative. EKG showed chronic a fib.   Assessment & Plan:   Principal Problem:   Acute on chronic diastolic CHF (congestive heart failure) (HCC) - Continue lasix per cardio recommendations  - ECHO showed preserved EF - Appreciate cardio following - Cardio changed lasix to PO regimen   Active Problems:   Chronic atrial fibrillation (HCC) - CHADS vasc score at least 3 - Coumadin on hold, TURP likely will be done in less than 2 week period; pt on Lovenox  - Continue atenolol for HR control     Essential hypertension - Continue Norvasc and atenolol     BPH - Continue Proscar and Flomax - Continue enablex - Pt was supposed to have TURP in [facility] ([place]), I called their office yesterday, canceled the procedure and admin staff to reach caregiver to resch procedure     Parkinson disease - Continue sinemet     Chronic ITP (idiopathic thrombocytopenia) (HCC) - Stable    Normocytic normochromic anemia - Stable    History of pulmonary embolism - Continue Lovenox subQ    Anxiety and depression - On Cymbalta and ativan PRN    DVT prophylaxis: Lovenox  subQ Code Status: full code  Family Communication: spoke with pt significant other [name] and gave an update; HH orders in place  Disposition Plan: home once cleared by cardio, refused SNF    Consultants:   PT  Cardio   Procedures:   ECHO 7/19 -mild LVH, normal EF  Antimicrobials:   None   Subjective: No overnight events.  Objective: Vitals:   04/14/17 1000 04/14/17 1501 04/14/17 2122 04/15/17 0543  BP: 132/72 99/60 (!) 143/82 (!) 144/75  Pulse: 91 76 78 71  Resp:  19 19 18   Temp: 98.2 F (36.8 C) 97.9 F (36.6 C) 98.7 F (37.1 C) 97.6 F (36.4 C)  TempSrc: Oral Oral    SpO2: 96% 93% 100% 97%  Weight:    106.3 kg (234 lb 4.8 oz)  Height:        Intake/Output Summary (Last 24 hours) at 04/15/17 1316 Last data filed at 04/15/17 0907  Gross per 24 hour  Intake              480 ml  Output             1050 ml  Net             -570 ml   Filed Weights   04/13/17 0543 04/14/17 0420 04/15/17 0543  Weight: 108.1 kg (238 lb 6.4 oz) 106.4 kg (234 lb 8 oz) 106.3 kg (234 lb 4.8 oz)  Physical Exam  Constitutional: Appears well-developed and well-nourished. No distress.  CVS: RRR, S1/S2 + Pulmonary: Effort and breath sounds normal, no stridor, rhonchi, wheezes, rales.  Abdominal: Soft. BS +,  no distension, tenderness, rebound or guarding.  Musculoskeletal: Normal range of motion. No edema and no tenderness.  Lymphadenopathy: No lymphadenopathy noted, cervical, inguinal. Neuro: Alert. No cranial nerve deficit. Skin: Skin is warm and dry. Psychiatric: Normal mood and affect.      Data Reviewed: I have personally reviewed following labs and imaging studies  CBC:  Recent Labs Lab 04/11/17 1735 04/13/17 0259 04/14/17 0437 04/15/17 0243  WBC 6.8 5.4 5.4 5.6  NEUTROABS 5.0  --   --   --   HGB 11.1* 11.3* 11.7* 11.9*  HCT 34.5* 36.5* 37.7* 38.7*  MCV 90.3 92.6 92.2 91.7  PLT 226 235 269 271   Basic Metabolic Panel:  Recent Labs Lab 04/11/17 1735  04/12/17 0340 04/13/17 0259 04/14/17 0437 04/15/17 0243  NA 130* 133* 136 135 134*  K 4.6 4.0 3.9 4.0 4.1  CL 97* 97* 96* 94* 93*  CO2 25 26 31  34* 33*  GLUCOSE 122* 95 142* 115* 119*  BUN 13 13 18 15 18   CREATININE 0.94 0.85 0.97 0.95 0.87  CALCIUM 8.9 8.7* 8.8* 9.1 9.4   GFR: Estimated Creatinine Clearance: 86.8 mL/min (by C-G formula based on SCr of 0.87 mg/dL). Liver Function Tests:  Recent Labs Lab 04/11/17 1735  AST 21  ALT <5*  ALKPHOS 89  BILITOT 1.2  PROT 7.1  ALBUMIN 3.7   No results for input(s): LIPASE, AMYLASE in the last 168 hours. No results for input(s): AMMONIA in the last 168 hours. Coagulation Profile:  Recent Labs Lab 04/12/17 0018 04/12/17 0340 04/13/17 0259  INR 1.18 1.15 0.95   Cardiac Enzymes:  Recent Labs Lab 04/11/17 1735  TROPONINI <0.03   BNP (last 3 results) No results for input(s): PROBNP in the last 8760 hours. HbA1C: No results for input(s): HGBA1C in the last 72 hours. CBG: No results for input(s): GLUCAP in the last 168 hours. Lipid Profile: No results for input(s): CHOL, HDL, LDLCALC, TRIG, CHOLHDL, LDLDIRECT in the last 72 hours. Thyroid Function Tests: No results for input(s): TSH, T4TOTAL, FREET4, T3FREE, THYROIDAB in the last 72 hours. Anemia Panel: No results for input(s): VITAMINB12, FOLATE, FERRITIN, TIBC, IRON, RETICCTPCT in the last 72 hours. Urine analysis:    Component Value Date/Time   COLORURINE YELLOW 03/23/2017 1328   APPEARANCEUR CLEAR 03/23/2017 1328   LABSPEC 1.015 03/23/2017 1328   PHURINE 5.0 03/23/2017 1328   GLUCOSEU NEGATIVE 03/23/2017 1328   HGBUR NEGATIVE 03/23/2017 1328   BILIRUBINUR NEGATIVE 03/23/2017 1328   KETONESUR NEGATIVE 03/23/2017 1328   PROTEINUR NEGATIVE 03/23/2017 1328   NITRITE NEGATIVE 03/23/2017 1328   LEUKOCYTESUR NEGATIVE 03/23/2017 1328   Sepsis Labs: @LABRCNTIP (procalcitonin:4,lacticidven:4)   )No results found for this or any previous visit (from the past 240  hour(s)).    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 Atherosclerotic calcification aorta. Bibasilar atelectasis. No gross acute infiltrate, pleural effusion or pneumothorax. No acute osseous findings. IMPRESSION: Enlargement of cardiac silhouette with pulmonary vascular congestion. Bibasilar atelectasis. Aortic Atherosclerosis (ICD10-I70.0). Electronically Signed  By: Ulyses Southward M.D.   On: 04/11/2017 18:02   Ct Head Wo Contrast  Result Date: 04/11/2017 CLINICAL DATA:  Patient fell with walker. EXAM: CT HEAD WITHOUT CONTRAST TECHNIQUE: Contiguous axial images were obtained from the base of the skull through the vertex without intravenous contrast. COMPARISON:  03/23/2017 and 09/10/2016 FINDINGS: BRAIN: There is mild crop sulcal and ventricular prominence consistent with superficial and central atrophy. No intraparenchymal hemorrhage, mass effect nor midline shift. Periventricular and subcortical white matter hypodensities consistent with chronic stable small vessel ischemic disease are identified. No acute large vascular territory infarcts. No abnormal extra-axial fluid collections. Basal cisterns are not effaced and midline. VASCULAR: Moderate calcific atherosclerosis of the carotid siphons. SKULL: No skull fracture. No significant scalp soft tissue swelling. Mild nonspecific patchy bony demineralization/osteopenia of the skullbase dating back to 2017. No focal lesions. SINUSES/ORBITS: The mastoid air-cells are clear. The included paranasal sinuses are well-aerated.The included ocular globes and orbital contents are non-suspicious. OTHER: None. IMPRESSION:  Chronic stable cerebral atrophy and small vessel ischemic disease. No acute intracranial abnormality. Electronically Signed   By: Tollie Eth M.D.   On: 04/11/2017 18:43        Scheduled Meds: . amLODipine  5 mg Oral Daily  . atenolol  100 mg Oral Daily  . carbidopa-levodopa  1 tablet Oral 2 times per day  . carbidopa-levodopa  2 tablet Oral BID WC  . carbidopa-levodopa  2 tablet Oral Once  . darifenacin  7.5 mg Oral Daily  . DULoxetine  90 mg Oral Daily  . enoxaparin (LOVENOX) injection  1 mg/kg Subcutaneous Q12H  . finasteride  5 mg Oral Daily  . furosemide  40 mg Oral BID  . potassium chloride  20 mEq Oral BID  . sodium chloride flush  3 mL Intravenous Q12H  . tamsulosin  0.4 mg Oral Daily   Continuous Infusions:   LOS: 3 days    Time spent: 15 minutes  Greater than 50% of the time spent on counseling and coordinating the care.   Manson Passey, MD [facility] Pager [phone]  If 7PM-7AM, please contact night-coverage [email or web address] Password [email or web address] 04/15/2017, 1:16 PM

## 2017-04-15 NOTE — Clinical Social Work Note (Signed)
CSW met with pt @ bedside, pt refusing SNF. Pt will go home with girlfriend Jana Half [phone]. CSW called Jana Half to confirm she was willing to take pt home. Jana Half reports she will try. CSW advised Jana Half that pt has met his 3 midnight stay and with Medicare pt would still be able to go to SNF if she decides it's too much for her to care for pt @ home. RNCM notified pt will likely need HHC.  Pt has no other identified DC needs.  Takirah Binford B. Joline Maxcy Clinical Social Work Dept Weekend Social Worker [phone] 11:26 AM

## 2017-04-15 NOTE — Care Management Note (Addendum)
Case Management Note  Patient Details  Name: Garrett RavelRobert Stewart MRN: [id number] Date of Birth: 1934/12/11  Subjective/Objective:   Pt presented for CHF- increased weight gain. Pt is from home- plans to return to significant other's [name]'s home at d/c. PT/OT recommendations for SNF. Pt is refusing SNF. CM did get permission to call significant other [name] that is a Charity fundraiserN. [name] states she will be with the patient 24/hrs day. Pt will d/c to [name]'s home. [name] states pt has used HH in the past and pt did not benefit from the services.                    Action/Plan: No HH services to be set up. Pt/ Significant other aware to call [facility]. CM did make Staff RN aware [facility]  No further needs from CM at this time.   Expected Discharge Date:                  Expected Discharge Plan:  Home/Self Care  In-House Referral:  Clinical Social Work  Discharge planning Services  CM Consult  Post Acute Care Choice:  Home Health Choice offered to:  Patient (Significant Other [name]. )  DME Arranged:  N/A DME Agency:  NA  HH Arranged:  Patient Refused HH HH Agency:  NA  Status of Service:  Completed, signed off  If discussed at Long Length of Stay Meetings, dates discussed:    Additional Comments:  Gala LewandowskyGraves-Bigelow, [name], RN 04/15/2017, 11:49 AM

## 2017-04-15 NOTE — Progress Notes (Signed)
Progress Note  Patient Name: Garrett Stewart Date of Encounter: 04/15/2017  Primary Cardiologist: Dr. Novella Rob, [place]  Subjective   SOB has improved.  Denies any chest pain.  LE edema resolved.  Inpatient Medications    Scheduled Meds: . amLODipine  5 mg Oral Daily  . atenolol  100 mg Oral Daily  . carbidopa-levodopa  1 tablet Oral 2 times per day  . carbidopa-levodopa  2 tablet Oral BID WC  . carbidopa-levodopa  2 tablet Oral Once  . darifenacin  7.5 mg Oral Daily  . DULoxetine  90 mg Oral Daily  . enoxaparin (LOVENOX) injection  1 mg/kg Subcutaneous Q12H  . finasteride  5 mg Oral Daily  . furosemide  40 mg Intravenous BID  . potassium chloride  20 mEq Oral BID  . sodium chloride flush  3 mL Intravenous Q12H  . tamsulosin  0.4 mg Oral Daily   Continuous Infusions:  PRN Meds: LORazepam, methocarbamol, polyethylene glycol   Vital Signs    Vitals:   04/14/17 1000 04/14/17 1501 04/14/17 2122 04/15/17 0543  BP: 132/72 99/60 (!) 143/82 (!) 144/75  Pulse: 91 76 78 71  Resp:  _0 Temp: 98.2 F (36.8 C) 97.9 F (36.6 C) 98.7 F (37.1 C) 97.6 F (36.4 C)  TempSrc: Oral Oral    SpO2: 96% 93% 100% 97%  Weight:    234 lb 4.8 oz (106.3 kg)  Height:        Intake/Output Summary (Last 24 hours) at 04/15/17 1033 Last data filed at 04/15/17 0559  Gross per 24 hour  Intake              480 ml  Output             1050 ml  Net             -570 ml   Filed Weights   04/13/17 0543 04/14/17 0420 04/15/17 0543  Weight: 238 lb 6.4 oz (108.1 kg) 234 lb 8 oz (106.4 kg) 234 lb 4.8 oz (106.3 kg)    Telemetry    Atrial fibrillation/flutter with CVR - Personally Reviewed  ECG    No new EKG to compare - Personally Reviewed  Physical Exam   GEN: No acute distress.   Neck: No JVD Cardiac: irreguarly irregular, no murmurs, rubs, or gallops.  Respiratory: Clear to auscultation bilaterally. GI: Soft, nontender, non-distended  MS: No edema; No deformity. Neuro:   Nonfocal  Psych: Normal affect   Labs    Chemistry Recent Labs Lab 04/11/17 1735  04/13/17 0259 04/14/17 0437 04/15/17 0243  NA 130*  < > 136 135 134*  K 4.6  < > 3.9 4.0 4.1  CL 97*  < > 96* 94* 93*  CO2 25  < > 31 34* 33*  GLUCOSE 122*  < > 142* 115* 119*  BUN 13  < > _1 CREATININE 0.94  < > 0.97 0.95 0.87  CALCIUM 8.9  < > 8.8* 9.1 9.4  PROT 7.1  --   --   --   --   ALBUMIN 3.7  --   --   --   --   AST 21  --   --   --   --   ALT <5*  --   --   --   --   ALKPHOS 89  --   --   --   --   BILITOT 1.2  --   --   --   --  GFRNONAA >60  < > >60 >60 >60  GFRAA >60  < > >60 >60 >60  ANIONGAP 8  < > _0 < > = values in this interval not displayed.   Hematology Recent Labs Lab 04/13/17 0259 04/14/17 0437 04/15/17 0243  WBC 5.4 5.4 5.6  RBC 3.94* 4.09* 4.22  HGB 11.3* 11.7* 11.9*  HCT 36.5* 37.7* 38.7*  MCV 92.6 92.2 91.7  MCH 28.7 28.6 28.2  MCHC 31.0 31.0 30.7  RDW 16.4* 16.0* 15.9*  PLT 235 269 271    Cardiac Enzymes Recent Labs Lab 04/11/17 1735  TROPONINI <0.03   No results for input(s): TROPIPOC in the last 168 hours.   BNP Recent Labs Lab 04/11/17 1712  BNP 281.5*     DDimer  Recent Labs Lab 04/11/17 1735  DDIMER 0.77*     Radiology    No results found.  Cardiac Studies   2D echo Study Conclusions  - Left ventricle: The cavity size was normal. Wall thickness was   increased in a pattern of mild LVH. Systolic function was   vigorous. The estimated ejection fraction was in the range of 65%   to 70%. Wall motion was normal; there were no regional wall   motion abnormalities. The study is not technically sufficient to   allow evaluation of LV diastolic function. - Aortic valve: Trileaflet. Sclerosis without stenosis. There was   no regurgitation. - Mitral valve: Calcified annulus. Mildly thickened leaflets .   There was moderate, eccentric regurgitation. - Left atrium: Moderately dilated. - Right atrium: Moderately  dilated. - Tricuspid valve: There was mild regurgitation. - Pulmonary arteries: PA peak pressure: 42 mm Hg (S). - Inferior vena cava: The vessel was dilated. The respirophasic   diameter changes were blunted (< 50%), consistent with elevated   central venous pressure.  Impressions:  - LVEF 65-70%, mild LVH, normal wall motion, aortic valve   sclerosis, MAC with moderate eccentric MR, moderate biatrial   enlargement, mild TR, RVSP 42 mmHg, dilated IVC.   Patient Profile     82 y.o. 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 Recent echocardiogram done on 02/17/17 at [facility] showed a normal left ventricular systolic function with EF greater than 55%, mild-to-moderate mitral regurgitation and Mild to moderate tricuspid regurgitation and elevated right ventricular systolic pressure of 53-66 mmHg.   Assessment & Plan    1. Acute on chronicdiastolic heart failure: - 2D echo with normal LV, mild LVH, moderate MR   - Dry weight not totally clear, was 231 on 4/12, 226 on 5/23, 243 on 5/31, and 239 on 04/06/17. - He put out 1.15L yesterday and is net neg 7.2L.  - Creatinine stable at 0.87.  - weight stable at 234lbs - he appears euvolemic on exam - will change Lasix to 58m BID PO  2. Chronic atrial fibrillation: The patient [facility]. He has been off coumadin in anticipation of prostate surgery on Friday which is now rescheduled for 7/26.  - Continue SQ Lovenox bridge for surgery.  - Long-term determination of candidacy for coumadin should be followed closely by primary cardiologist in setting of his recent fall, alcohol use and Parkinson's disease. - continue Atenolol for rate control  3. Hypertension: - his  BP is controlled - continue amlodipine 72m daily, atenolol 102mdaily   Signed, TrFransico HimMD  04/15/2017, 10:33 AM

## 2017-04-16 LAB — BASIC METABOLIC PANEL
Anion gap: 9 (ref 5–15)
BUN: 14 mg/dL (ref 6–20)
CHLORIDE: 95 mmol/L — AB (ref 101–111)
CO2: 31 mmol/L (ref 22–32)
Calcium: 9.3 mg/dL (ref 8.9–10.3)
Creatinine, Ser: 1.02 mg/dL (ref 0.61–1.24)
GFR calc Af Amer: >60 mL/min (ref >60)
GFR calc non Af Amer: >60 mL/min (ref >60)
GLUCOSE: 123 mg/dL — AB (ref 65–99)
POTASSIUM: 4.3 mmol/L (ref 3.5–5.1)
Sodium: 135 mmol/L (ref 135–145)

## 2017-04-16 LAB — CBC
HEMATOCRIT: 38 % — AB (ref 39.0–52.0)
HEMOGLOBIN: 11.9 g/dL — AB (ref 13.0–17.0)
MCH: 28.5 pg (ref 26.0–34.0)
MCHC: 31.3 g/dL (ref 30.0–36.0)
MCV: 90.9 fL (ref 78.0–100.0)
Platelets: 264 10*3/uL (ref 150–400)
RBC: 4.18 MIL/uL — ABNORMAL LOW (ref 4.22–5.81)
RDW: 15.7 % — AB (ref 11.5–15.5)
WBC: 6.1 10*3/uL (ref 4.0–10.5)

## 2017-04-16 MED ORDER — POTASSIUM CHLORIDE CRYS ER 20 MEQ PO TBCR: 20.0000 meq | EXTENDED_RELEASE_TABLET | Freq: Two times a day (BID) | ORAL | 0 refills | Status: AC

## 2017-04-16 MED ORDER — ENOXAPARIN SODIUM 120 MG/0.8ML ~~LOC~~ SOLN: 1.0000 mg/kg | Freq: Two times a day (BID) | SUBCUTANEOUS | 0 refills | Status: AC

## 2017-04-16 MED ORDER — FUROSEMIDE 40 MG PO TABS: 40.0000 mg | ORAL_TABLET | Freq: Two times a day (BID) | ORAL | 0 refills | Status: AC

## 2017-04-16 NOTE — Discharge Summary (Signed)
Physician Discharge Summary  Garrett RavelRobert Stewart WJX:[id number] DOB: 11-03-1934 DOA: 04/11/2017  PCP: Paulino DoorWeiser, Mark, MD  Admit date: 04/11/2017 Discharge date: 04/16/2017  Recommendations for Outpatient Follow-up:  Please continue Lovenox 110 mg Q 12 hours on discharge instead of coumadin in case procedure for prostate is planned for next 2 weeks  Discharge Diagnoses:  Principal Problem:   Acute on chronic diastolic CHF (congestive heart failure) (HCC) Active Problems:   Chronic atrial fibrillation (HCC)   Chronic ITP (idiopathic thrombocytopenia) (HCC)   Essential hypertension   Parkinson's disease (HCC)   Normocytic normochromic anemia   History of pulmonary embolism   Benign prostatic hyperplasia without urinary obstruction   Hyponatremia   Acute CHF (congestive heart failure) (HCC)    Discharge Condition: stable   Diet recommendation: as tolerated   History of present illness:  82 year old male with ITP, history of PE, chronic A fib on coumadin, Parkinson disease, hypertension, 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 Pt presented to ED with 1 week of worsening dyspnea on exertion. He has fallen about a week prior to admission, was evaluated and found to have no fractures. Since then, family has noticed his weight has been increasing (thought to be 210 lbs and now 242 lbs) and he had increased leg swelling.  In ED, pt was afebrile, RR 22 and pulse ox in 80's but this has improved to 94% with 2 L [place] oxygen support. CXR showed no acute findings. CT head unremarkable. Troponin was negative. EKG showed chronic a fib.  Hospital Course:  Principal Problem: Acute on chronic diastolic CHF (congestive heart failure) (HCC) - Continue lasix per cardio recommendations  - ECHO showed preserved EF - Cardio has seen the pt in consultation  - Continue PO lasix   Active Problems: Chronic atrial fibrillation (HCC) - CHADS vasc score at least 3 - Coumadin on hold, TURP likely  will be done in less than 2 week period; pt on Lovenox  - Continue atenolol   Essential hypertension - Continue Norvasc and atenolol   BPH - Continue Proscar and Flomax - Continue enablex - Pt was supposed to have TURP in [place] ([place]), I called their office yesterday, canceled the procedure and admin staff to reach caregiver to resch procedure   Parkinson disease - Continue sinemet   Chronic ITP (idiopathic thrombocytopenia) (HCC) - Stable   Normocytic normochromic anemia - Stable  History of pulmonary embolism - Continue Lovenox subQ    Anxiety and depression - On Cymbalta and ativan PRN    DVT prophylaxis: Lovenox subQ Code Status: full code  Family Communication:updated [name], pt significant other      Consultants:   PT  Cardio   Procedures:   ECHO 7/19 -mild LVH, normal EF  Antimicrobials:   None    Signed:  Manson PasseyAlma Ramces Shomaker, MD  [facility] 04/16/2017, 12:17 PM  Pager #: [phone]  Time spent in minutes: more than 30 minutes   Discharge Exam: Vitals:   04/15/17 2207 04/16/17 0532  BP: 122/74 115/65  Pulse: 75 64  Resp: 20 18  Temp: 97.7 F (36.5 C) 97.9 F (36.6 C)   Vitals:   04/15/17 1513 04/15/17 2207 04/16/17 0532 04/16/17 1000  BP: (!) 159/72 122/74 115/65   Pulse: 86 75 64   Resp: 18 20 18    Temp: 98.3 F (36.8 C) 97.7 F (36.5 C) 97.9 F (36.6 C)   TempSrc:   Oral   SpO2: 95% 94% 97%   Weight:  110.3 kg (243 lb 3.2 oz)  Height:        General: Pt is alert, follows commands appropriately, not in acute distress Cardiovascular: Regular rate and rhythm, S1/S2 + Respiratory: Clear to auscultation bilaterally, no wheezing, no crackles, no rhonchi Abdominal: Soft, non tender, non distended, bowel sounds +, no guarding Extremities: no edema, no cyanosis, pulses palpable bilaterally DP and PT Neuro: Grossly nonfocal  Discharge Instructions  Discharge Instructions    Call MD for:   persistant nausea and vomiting    Complete by:  As directed    Call MD for:  redness, tenderness, or signs of infection (pain, swelling, redness, odor or green/yellow discharge around incision site)    Complete by:  As directed    Call MD for:  severe uncontrolled pain    Complete by:  As directed    Diet - low sodium heart healthy    Complete by:  As directed    Diet - low sodium heart healthy    Complete by:  As directed    Discharge instructions    Complete by:  As directed    Discharge instructions    Complete by:  As directed    Please continue Lovenox 110 mg Q 12 hours on discharge instead of coumadin in case procedure for prostate is planned for next 2 weeks   Increase activity slowly    Complete by:  As directed    Increase activity slowly    Complete by:  As directed      Allergies as of 04/16/2017      Reactions   Statins Other (See Comments)   Reaction unknown to patient      Medication List    STOP taking these medications   warfarin 5 MG tablet Commonly known as:  COUMADIN     TAKE these medications   acetaminophen 325 MG tablet Commonly known as:  TYLENOL Take 650 mg by mouth every 6 (six) hours as needed for moderate pain or headache.   amLODipine 5 MG tablet Commonly known as:  NORVASC Take 5 mg by mouth at bedtime.   atenolol 100 MG tablet Commonly known as:  TENORMIN Take 100 mg by mouth daily.   busPIRone 10 MG tablet Commonly known as:  BUSPAR Take 10 mg by mouth 3 (three) times daily.   carbidopa-levodopa 25-100 MG tablet Commonly known as:  SINEMET IR Take 2 tablets by mouth 3 (three) times daily.   DULoxetine 30 MG capsule Commonly known as:  CYMBALTA Take 90 mg by mouth daily.   enoxaparin 120 MG/0.8ML injection Commonly known as:  LOVENOX Inject 0.74 mLs (110 mg total) into the skin every 12 (twelve) hours.   finasteride 5 MG tablet Commonly known as:  PROSCAR Take 5 mg by mouth daily.   furosemide 40 MG tablet Commonly known  as:  LASIX Take 1 tablet (40 mg total) by mouth 2 (two) times daily. What changed:  when to take this  reasons to take this   LORazepam 0.5 MG tablet Commonly known as:  ATIVAN Take 1 tablet (0.5 mg total) by mouth as needed for anxiety. What changed:  when to take this   methocarbamol 750 MG tablet Commonly known as:  ROBAXIN Take 750 mg by mouth 4 (four) times daily as needed for muscle spasms.   MUSCLE RUB 10-15 % Crea Apply 1 application topically 2 (two) times daily as needed for muscle pain.   polyethylene glycol packet Commonly known as:  MIRALAX /  GLYCOLAX Take 17 g by mouth daily as needed for moderate constipation.   potassium chloride SA 20 MEQ tablet Commonly known as:  K-DUR,KLOR-CON Take 1 tablet (20 mEq total) by mouth 2 (two) times daily.   tamsulosin 0.4 MG Caps capsule Commonly known as:  FLOMAX Take 0.4 mg by mouth at bedtime.   traMADol-acetaminophen 37.5-325 MG tablet Commonly known as:  ULTRACET Take 1 tablet by mouth every 6 (six) hours as needed (for pain).   trospium 20 MG tablet Commonly known as:  SANCTURA Take 20 mg by mouth 2 (two) times daily.      Follow-up Information    Paulino Door, MD. Schedule an appointment as soon as possible for a visit in 1 week(s).   Specialty:  Family Medicine Contact information: [address] [place] [address] [phone]            The results of significant diagnostics from this hospitalization (including imaging, microbiology, ancillary and laboratory) are listed below for reference.    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 Atherosclerotic calcification aorta. Bibasilar atelectasis. No gross acute infiltrate, pleural effusion or pneumothorax. No acute osseous findings. IMPRESSION: Enlargement of cardiac silhouette with pulmonary vascular congestion. Bibasilar atelectasis. Aortic Atherosclerosis (ICD10-I70.0). Electronically Signed   By: Ulyses Southward M.D.   On: 04/11/2017 18:02   Dg Chest 2 View  Result Date: 03/23/2017 CLINICAL DATA:  82 year old presenting with generalized weakness that began earlier today. He also complains of a 1 month history of cough and intermittent headaches. Nonsmoker. EXAM: CHEST  2 VIEW COMPARISON:  11/30/2016, 09/12/2016, 09/10/2016. FINDINGS: AP semi-erect and lateral images were obtained. Cardiac silhouette upper normal in size for the AP technique. Thoracic aorta atherosclerotic, unchanged. Hilar and mediastinal contours otherwise unremarkable. Lungs clear. Bronchovascular markings normal. Pulmonary vascularity normal. No visible pleural effusions. No pneumothorax. Degenerative changes involving the thoracic spine. IMPRESSION: No acute cardiopulmonary disease.  Thoracic aortic atherosclerosis. Electronically Signed   By: Hulan Saas M.D.   On: 03/23/2017 16:57   Ct Head Wo Contrast  Result Date: 04/11/2017 CLINICAL DATA:  Patient fell with walker. EXAM: CT HEAD WITHOUT CONTRAST TECHNIQUE: Contiguous axial images were obtained from the base of the skull through the vertex without intravenous contrast. COMPARISON:  03/23/2017 and 09/10/2016 FINDINGS: BRAIN: There is mild crop sulcal and ventricular prominence consistent with superficial and central atrophy. No intraparenchymal hemorrhage, mass effect nor midline shift. Periventricular and subcortical white matter hypodensities consistent with chronic stable small vessel ischemic disease are identified. No acute large vascular territory infarcts. No abnormal extra-axial fluid collections. Basal cisterns are not effaced and midline.  VASCULAR: Moderate calcific atherosclerosis of the carotid siphons. SKULL: No skull fracture. No significant scalp soft tissue swelling. Mild nonspecific patchy bony demineralization/osteopenia of the skullbase dating back to 2017. No focal lesions. SINUSES/ORBITS: The mastoid air-cells are clear. The included paranasal sinuses are well-aerated.The included ocular globes and orbital contents are non-suspicious. OTHER: None. IMPRESSION: Chronic stable cerebral atrophy and small vessel ischemic disease. No acute intracranial abnormality. Electronically Signed   By: Tollie Eth M.D.   On: 04/11/2017 18:43   Ct Head Wo Contrast  Result Date: 03/23/2017 CLINICAL DATA:  82 year old male with headache and generalize weakness since this morning. EXAM: CT HEAD WITHOUT CONTRAST TECHNIQUE: Contiguous axial images were obtained from the base of the skull through the vertex without intravenous contrast. COMPARISON:  Brain MRI 11/30/2016, head CT 11/30/2016 and earlier. FINDINGS: Brain: Stable cerebral volume. No midline shift, ventriculomegaly, mass effect, evidence of mass lesion, intracranial hemorrhage or evidence of cortically based acute infarction. Gray-white matter differentiation appears stable and normal for age. No cortical encephalomalacia identified. Vascular: Calcified atherosclerosis at the skull base. No suspicious intracranial vascular hyperdensity. Skull: Stable.  No acute osseous abnormality identified. Sinuses/Orbits: Visualized paranasal sinuses and mastoids are stable and well pneumatized. Other: Visualized orbits and scalp soft tissues are within normal limits. IMPRESSION: Stable and normal for age noncontrast CT appearance of the brain. Electronically Signed   By: Odessa Fleming M.D.   On: 03/23/2017 13:01    Microbiology: No results found for this or any previous visit (from the past 240 hour(s)).   Labs: Basic Metabolic Panel:  Recent Labs Lab 04/12/17 0340 04/13/17 0259 04/14/17 0437  04/15/17 0243 04/16/17 0616  NA 133* 136 135 134* 135  K 4.0 3.9 4.0 4.1 4.3  CL 97* 96* 94* 93* 95*  CO2 26 31 34* 33* 31  GLUCOSE 95 142* 115* 119* 123*  BUN 13 18 15 18 14   CREATININE 0.85 0.97 0.95 0.87 1.02  CALCIUM 8.7* 8.8* 9.1 9.4 9.3   Liver Function Tests:  Recent Labs Lab 04/11/17 1735  AST 21  ALT <5*  ALKPHOS 89  BILITOT 1.2  PROT 7.1  ALBUMIN 3.7   No results for input(s): LIPASE, AMYLASE in the last 168 hours. No results for input(s): AMMONIA in the last 168 hours. CBC:  Recent Labs Lab 04/11/17 1735 04/13/17 0259 04/14/17 0437 04/15/17 0243 04/16/17 0616  WBC 6.8 5.4 5.4 5.6 6.1  NEUTROABS 5.0  --   --   --   --   HGB 11.1* 11.3* 11.7* 11.9* 11.9*  HCT 34.5* 36.5* 37.7* 38.7* 38.0*  MCV 90.3 92.6 92.2 91.7 90.9  PLT 226 235 269 271 264   Cardiac Enzymes:  Recent Labs Lab 04/11/17 1735  TROPONINI <0.03   BNP: BNP (last 3 results)  Recent Labs  04/11/17 1712  BNP 281.5*    ProBNP (last 3 results) No results for input(s): PROBNP in the last 8760 hours.  CBG: No results for input(s): GLUCAP in the last 168 hours.

## 2017-04-16 NOTE — Discharge Instructions (Signed)
Enoxaparin injection What is this medicine? ENOXAPARIN (ee nox a PA rin) is used after knee, hip, or abdominal surgeries to prevent blood clotting. It is also used to treat existing blood clots in the lungs or in the veins. This medicine may be used for other purposes; ask your health care provider or pharmacist if you have questions. COMMON BRAND NAME(S): Lovenox What should I tell my health care provider before I take this medicine? They need to know if you have any of these conditions: -bleeding disorders, hemorrhage, or hemophilia -infection of the heart or heart valves -kidney or liver disease -previous stroke -prosthetic heart valve -recent surgery or delivery of a baby -ulcer in the stomach or intestine, diverticulitis, or other bowel disease -an unusual or allergic reaction to enoxaparin, heparin, pork or pork products, other medicines, foods, dyes, or preservatives -pregnant or trying to get pregnant -breast-feeding How should I use this medicine? This medicine is for injection under the skin. It is usually given by a health-care professional. You or a family member may be trained on how to give the injections. If you are to give yourself injections, make sure you understand how to use the syringe, measure the dose if necessary, and give the injection. To avoid bruising, do not rub the site where this medicine has been injected. Do not take your medicine more often than directed. Do not stop taking except on the advice of your doctor or health care professional. Make sure you receive a puncture-resistant container to dispose of the needles and syringes once you have finished with them. Do not reuse these items. Return the container to your doctor or health care professional for proper disposal. Talk to your pediatrician regarding the use of this medicine in children. Special care may be needed. Overdosage: If you think you have taken too much of this medicine contact a poison control  center or emergency room at once. NOTE: This medicine is only for you. Do not share this medicine with others. What if I miss a dose? If you miss a dose, take it as soon as you can. If it is almost time for your next dose, take only that dose. Do not take double or extra doses. What may interact with this medicine? -aspirin and aspirin-like medicines -certain medicines that treat or prevent blood clots -dipyridamole -NSAIDs, medicines for pain and inflammation, like ibuprofen or naproxen This list may not describe all possible interactions. Give your health care provider a list of all the medicines, herbs, non-prescription drugs, or dietary supplements you use. Also tell them if you smoke, drink alcohol, or use illegal drugs. Some items may interact with your medicine. What should I watch for while using this medicine? Visit your doctor or health care professional for regular checks on your progress. Your condition will be monitored carefully while you are receiving this medicine. Notify your doctor or health care professional and seek emergency treatment if you develop breathing problems; changes in vision; chest pain; severe, sudden headache; pain, swelling, warmth in the leg; trouble speaking; sudden numbness or weakness of the face, arm, or leg. These can be signs that your condition has gotten worse. If you are going to have surgery, tell your doctor or health care professional that you are taking this medicine. Do not stop taking this medicine without first talking to your doctor. Be sure to refill your prescription before you run out of medicine. Avoid sports and activities that might cause injury while you are using this medicine. Severe   falls or injuries can cause unseen bleeding. Be careful when using sharp tools or knives. Consider using an electric razor. Take special care brushing or flossing your teeth. Report any injuries, bruising, or red spots on the skin to your doctor or health care  professional. What side effects may I notice from receiving this medicine? Side effects that you should report to your doctor or health care professional as soon as possible: -allergic reactions like skin rash, itching or hives, swelling of the face, lips, or tongue -feeling faint or lightheaded, falls -signs and symptoms of bleeding such as bloody or black, tarry stools; red or dark-brown urine; spitting up blood or brown material that looks like coffee grounds; red spots on the skin; unusual bruising or bleeding from the eye, gums, or nose Side effects that usually do not require medical attention (report to your doctor or health care professional if they continue or are bothersome): -pain, redness, or irritation at site where injected This list may not describe all possible side effects. Call your doctor for medical advice about side effects. You may report side effects to FDA at 1-800-FDA-1088. Where should I keep my medicine? Keep out of the reach of children. Store at room temperature between 15 and 30 degrees C (59 and 86 degrees F). Do not freeze. If your injections have been specially prepared, you may need to store them in the refrigerator. Ask your pharmacist. Throw away any unused medicine after the expiration date. NOTE: This sheet is a summary. It may not cover all possible information. If you have questions about this medicine, talk to your doctor, pharmacist, or health care provider.  2018 Elsevier/Gold Standard (2014-01-14 16:06:21)  

## 2017-04-16 NOTE — Progress Notes (Signed)
ANTICOAGULATION CONSULT NOTE - Initial Consult  Pharmacy Consult for Lovenox Indication: atrial fibrillation and hx pulmonary embolus  Allergies  Allergen Reactions  . Statins Other (See Comments)    Reaction unknown to patient    Patient Measurements: Height: 6' (182.9 cm) Weight: 234 lb 4.8 oz (106.3 kg) IBW/kg (Calculated) : 77.6  Vital Signs: Temp: 97.9 F (36.6 C) (07/22 0532) Temp Source: Oral (07/22 0532) BP: 115/65 (07/22 0532) Pulse Rate: 64 (07/22 0532)  Labs:  Recent Labs  04/14/17 0437 04/15/17 0243 04/16/17 0616  HGB 11.7* 11.9* 11.9*  HCT 37.7* 38.7* 38.0*  PLT 269 271 264  CREATININE 0.95 0.87 1.02    Estimated Creatinine Clearance: 74 mL/min (by C-G formula based on SCr of 1.02 mg/dL).   Medical History: Past Medical History:  Diagnosis Date  . A-fib (HCC)   . Anxiety   . Arthritis    "mainly in my back" (04/11/2017)  . BPH (benign prostatic hyperplasia)   . CHF (congestive heart failure), NYHA class I, acute on chronic, combined (HCC) 04/11/2017  . Chronic ITP (idiopathic thrombocytopenic purpura) (HCC)   . Chronic lower back pain   . Daily headache    "from Carbidopa" (04/11/2017)  . Depression   . High cholesterol    "I quit taking my RX; I'm allergic to all them" (04/11/2017)  . History of gout   . Hypertension   . Parkinson's disease (HCC)   . Pneumonia 08/2016  . Pulmonary embolism (HCC) ~ 06/2013  . Stroke [facility](HCC)    "I've had 2; caused my right foot to fan out" (04/11/2017)    Medications:  Scheduled:  . amLODipine  5 mg Oral Daily  . atenolol  100 mg Oral Daily  . carbidopa-levodopa  1 tablet Oral 2 times per day  . carbidopa-levodopa  2 tablet Oral BID WC  . carbidopa-levodopa  2 tablet Oral Once  . darifenacin  7.5 mg Oral Daily  . DULoxetine  90 mg Oral Daily  . enoxaparin (LOVENOX) injection  1 mg/kg Subcutaneous Q12H  . finasteride  5 mg Oral Daily  . furosemide  40 mg Oral BID  . potassium chloride  20 mEq Oral BID   . sodium chloride flush  3 mL Intravenous Q12H  . tamsulosin  0.4 mg Oral Daily   Infusions:    Assessment: 82 yo M admitted with fatigue, progressive dyspnea x 1 week, and 30# weight gain. Pt on Coumadin PTA, however this was being held with plan for TURP procedure 7/20. Procedure was delayed due to this admission and has been rescheduled for 7/26. Pt on treatment dose Lovenox in the interim, and to continue at discharge until surgery.  Scr relatively stable, but increased to 1.02 today. H&H stable 11.9/38 today, pltc 264. No evidence of bleeding noted.  Goal of Therapy:  Anti-Xa level 0.6-1 units/ml 4hrs after LMWH dose given Monitor platelets by anticoagulation protocol: Yes   Plan:  Continue Lovenox 110mg  SQ q12h Monitor CBC   Erin N. Zigmund Danieleja, PharmD PGY1 Pharmacy Resident Pager: [phone] 04/16/2017 7:36 AM

## 2017-05-02 ENCOUNTER — Telehealth: Payer: Self-pay | Admitting: Radiology

## 2017-05-04 ENCOUNTER — Telehealth: Payer: Self-pay | Admitting: Radiology

## 2017-05-08 ENCOUNTER — Telehealth: Payer: Self-pay | Admitting: Neurology

## 2017-05-08 NOTE — Telephone Encounter (Signed)
-----   Message from April H Pait sent at 05/05/2017 11:05 AM EDT ----- Regarding: datscan per [name] pt has been ill, will call me to schedule when better, has my contact info

## 2017-07-17 NOTE — Progress Notes (Deleted)
Garrett Stewart was seen today in the movement disorders clinic for neurologic consultation at the request of Garrett Stewart.  His PCP is  Garrett Door, MD.  Pt accompanied by girlfriend who supplements the history.  The consultation is for the evaluation of PD.  Last seen by Garrett Stewart in 01/2017.  Pt has a complicated hx.  The records that were made available to me were reviewed.  The patient has a history of spontaneous intracranial hemorrhage in 2013 with platelet count at the time of 1000.  He was diagnosed with ITP.  He also has a history of paroxysmal A. fib, but because of EtOH abuse and fluctuating INR, he was initially not anticoagulated.  He had a PE in 2014 and was put back on coumadin.  Records indicate that right arm tremor began in approximately 2012-2013.  Records are not clear but he has been on carbidopa/levodopa at least since 2015.  It appears that he was given Neupro to try in 2015, but never actually took it after reading about potential side effects.  In 2016, he was given a prescription for Azilect, but did not initally take that because of cost but later did and thought that it caused swelling and it was d/c.  He was started on ropinirole in 2016, but stopped it not long after he took it because he thought it caused leg cramping.  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  He is currently on carbidopa/levodopa 25/100, and he is supposed to be on  2/1/2/1 but he is actually on 2 po tid. He thinks that it may help the tremor.  He is supposed to be on requip per Garrett Stewart' notes but is notes. When he last saw Garrett Stewart in 01/2017, she reported that pt had d/c EtOH and that he was angry and agitated.  Specific Symptoms:  Tremor: Yes.  , R arm/hand and rarely in the L finger Family hx of similar:  No. Voice: fluctuates per patient; more difficult to understand per girlfriend Sleep: trouble getting and staying asleep  Vivid Dreams:  No.  Acting out dreams:  No. Wet  Pillows: No. Postural symptoms:  Yes.    Falls?  Yes.  , used to be every other day but less frequently now that living with girlfriend and she is insisting he uses walker Bradykinesia symptoms: difficulty getting out of a chair, difficulty regaining balance and freezing and festinating Loss of smell:  No. Loss of taste:  No. Urinary Incontinence:  Yes.  , wearing undergarments - told due to BPH and supposed to have TURP on Friday Difficulty Swallowing:  Yes.  , sounds like had MBE about a year ago (I could not find that ) Handwriting, micrographia: Yes.   Trouble with ADL's:  Yes.    Trouble buttoning clothing: Yes.   Depression:  More angry/irritable Memory changes:  No. per patient; girlfriend says it is off Hallucinations:  No.  visual distortions: No. N/V:  No. Lightheaded:  Yes.  , rarely  Syncope: No. Diplopia:  No. Dyskinesia:  No.  07/18/17 update: Patient seen today.  I have reviewed extensive medical records made available to me.  Patient was seen in the emergency because of a fall at a restaurant.  He was ultimately admitted that day for congestive heart failure.  Patient arrived in the emergency room in August with mental status change.  He was intoxicated.  He has a long history of alcohol abuse.  He was  admitted to the hospital in September for cough with peptic ulcer disease.  He had a barium swallow in September, 2018 demonstrating penetration after swallowing thin liquid barium and also after swallowing nectar.  Nectar thick liquids were recommended.  I did order a DaT scan since last visit but the patient didn't schedule that so it wasn't completed.  I asked him to hold his carbidopa/levodopa 25/100, 2 po tid for the visit today so that I could see what he looks like off of medication.  PREVIOUS MEDICATIONS: Sinemet and Requip Neupro (was given but never actually took after he read about potential side effects); azilect (thought that it caused LE edema)  ALLERGIES:     Allergies  Allergen Reactions  . Statins Other (See Comments)    Reaction unknown to patient    CURRENT MEDICATIONS:  Outpatient Encounter Prescriptions as of 07/18/2017  Medication Sig  . acetaminophen (TYLENOL) 325 MG tablet Take 650 mg by mouth every 6 (six) hours as needed for moderate pain or headache.  Marland Kitchen. amLODipine (NORVASC) 5 MG tablet Take 5 mg by mouth at bedtime.   Marland Kitchen. atenolol (TENORMIN) 100 MG tablet Take 100 mg by mouth daily.  . busPIRone (BUSPAR) 10 MG tablet Take 10 mg by mouth 3 (three) times daily.  . carbidopa-levodopa (SINEMET IR) 25-100 MG tablet Take 2 tablets by mouth 3 (three) times daily.   . DULoxetine (CYMBALTA) 30 MG capsule Take 90 mg by mouth daily.   Marland Kitchen. enoxaparin (LOVENOX) 120 MG/0.8ML injection Inject 0.74 mLs (110 mg total) into the skin every 12 (twelve) hours.  . finasteride (PROSCAR) 5 MG tablet Take 5 mg by mouth daily.  . furosemide (LASIX) 40 MG tablet Take 1 tablet (40 mg total) by mouth 2 (two) times daily.  Marland Kitchen. LORazepam (ATIVAN) 0.5 MG tablet Take 1 tablet (0.5 mg total) by mouth as needed for anxiety. (Patient taking differently: Take 0.5 mg by mouth every 8 (eight) hours as needed for anxiety. )  . Menthol-Methyl Salicylate (MUSCLE RUB) 10-15 % CREA Apply 1 application topically 2 (two) times daily as needed for muscle pain.  . methocarbamol (ROBAXIN) 750 MG tablet Take 750 mg by mouth 4 (four) times daily as needed for muscle spasms.  . polyethylene glycol (MIRALAX / GLYCOLAX) packet Take 17 g by mouth daily as needed for moderate constipation.  . potassium chloride SA (K-DUR,KLOR-CON) 20 MEQ tablet Take 1 tablet (20 mEq total) by mouth 2 (two) times daily.  . tamsulosin (FLOMAX) 0.4 MG CAPS capsule Take 0.4 mg by mouth at bedtime.   . traMADol-acetaminophen (ULTRACET) 37.5-325 MG tablet Take 1 tablet by mouth every 6 (six) hours as needed (for pain).   . trospium (SANCTURA) 20 MG tablet Take 20 mg by mouth 2 (two) times daily.   No  facility-administered encounter medications on file as of 07/18/2017.     PAST MEDICAL HISTORY:   Past Medical History:  Diagnosis Date  . A-fib (HCC)   . Anxiety   . Arthritis    "mainly in my back" (04/11/2017)  . BPH (benign prostatic hyperplasia)   . CHF (congestive heart failure), NYHA class I, acute on chronic, combined (HCC) 04/11/2017  . Chronic ITP (idiopathic thrombocytopenic purpura) (HCC)   . Chronic lower back pain   . Daily headache    "from Carbidopa" (04/11/2017)  . Depression   . High cholesterol    "I quit taking my RX; I'm allergic to all them" (04/11/2017)  . History of gout   .  Hypertension   . Parkinson's disease (HCC)   . Pneumonia 08/2016  . Pulmonary embolism (HCC) ~ 06/2013  . Stroke [facility])    "I've had 2; caused my right foot to fan out" (04/11/2017)    PAST SURGICAL HISTORY:   Past Surgical History:  Procedure Laterality Date  . CHOLECYSTECTOMY    . HERNIA REPAIR    . INGUINAL HERNIA REPAIR Bilateral   . TONSILLECTOMY    . UMBILICAL HERNIA REPAIR      SOCIAL HISTORY:   Social History   Social History  . Marital status: Widowed    Spouse name: N/A  . Number of children: N/A  . Years of education: N/A   Occupational History  . retired     Veterinary surgeon   Social History Main Topics  . Smoking status: Never Smoker  . Smokeless tobacco: Current User    Types: Chew  . Alcohol use Yes     Comment: 04/11/2017 "go for long periods of time when I don't drink any; then anxiety/depression takes over & I drink too much; last drink was ~ 1 wk ago; hx of EtOH abuse"  . Drug use: No  . Sexual activity: Not Currently   Other Topics Concern  . Not on file   Social History Narrative  . No narrative on file    FAMILY HISTORY:   Family Status  Relation Status  . Mother Deceased  . Father Deceased  . Son Deceased  . Child Alive    ROS:  Fatigue.  Admits to SOB.  A complete 10 system review of systems was obtained and was  unremarkable apart from what is mentioned above.  PHYSICAL EXAMINATION:    VITALS:   There were no vitals filed for this visit.  GEN:  The patient appears stated age and is in NAD. HEENT:  Normocephalic, atraumatic.  The mucous membranes are moist. The superficial temporal arteries are without ropiness or tenderness. CV:  Irreg irreg Lungs:  CTAB.  Some DOE Neck/HEME:  There are no carotid bruits bilaterally.  Neurological examination:  Orientation:  Montreal Cognitive Assessment  04/11/2017  Visuospatial/ Executive (0/5) 2  Naming (0/3) 3  Attention: Read list of digits (0/2) 2  Attention: Read list of letters (0/1) 1  Attention: Serial 7 subtraction starting at 100 (0/3) 1  Language: Repeat phrase (0/2) 2  Language : Fluency (0/1) 0  Abstraction (0/2) 2  Delayed Recall (0/5) 1  Orientation (0/6) 6  Total 20  Adjusted Score (based on education) 20   Cranial nerves: There is good facial symmetry. Pupils are equal round and reactive to light bilaterally. Fundoscopic exam reveals clear margins bilaterally. Extraocular muscles are intact. The visual fields are full to confrontational testing. The speech is fluent and clear.   He has pseudobulbar speech.  Soft palate rises symmetrically and there is no tongue deviation. Hearing is intact to conversational tone. Sensation: Sensation is intact to light and pinprick throughout (facial, trunk, extremities). Vibration is intact at the bilateral big toe but it is decreased. There is no extinction with double simultaneous stimulation. There is no sensory dermatomal level identified. Motor: Strength is 5/5 in the bilateral upper and L lower extremities.  Strength is 4/5 in the proximal RLE (states that way ever since stroke).  R foot is externally rotated (started after a fall but reports is getting worse).   Shoulder shrug is equal and symmetric.  There is no pronator drift. Deep tendon reflexes: Deep tendon reflexes are  2-/4 at the bilateral  biceps, triceps, brachioradialis, patella and achilles. Plantar responses are downgoing bilaterally.  Movement examination: Tone: There is normal tone in the bilateral upper extremities.  The tone in the lower extremities is normal.  Abnormal movements: none Coordination:  There is decremation with RAM's, seen mostly with heel and toe taps bilaterally Gait and Station: The patient has difficulty arising out of a deep-seated chair without the use of the hands. The patient ambulates with a Rollator.  He flexes at the waist and pushes the walker very quickly.  He is unstable.  He does not shuffle.  He is not short stepped.  He does festinate.  When the walker is taken away, he is much more unstable.  Part of this is orthopedic due to the external rotation of the right ankle from ankle injury.  ASSESSMENT/PLAN:  1.  Parkinsonism, by history.  -The patient was on levodopa today, which is likely covering up some of his symptoms.  He reports that he does have right hand tremor when he is off of medication.  I did not see this today.  I am concerned, however, that he may not have idiopathic Parkinson's disease.  I am concerned about one of the atypical states.  He certainly has a pseudobulbar speech pattern.  He also has a significant number of falls as well as swallowing difficulty.  This certainly can come with Parkinson's disease, but the pseudobulbar speech is a bit rare.  I would like to see him off of levodopa so he will hold next visit before I see him.  Until then, he can continue carbidopa/levodopa 25/100 2 po tid (has had some compliance issues)  -needs to continue to use walker at all times.  -will reorder MBE if I cannot find his old one.  I looked for it for quite sometime within care everywhere and I could not find it.  I will continue to try and look.  (Addendum:  Found MBE.  He had said was done at [facility] but was done within our own system and hadn't even looked there.  Done in 11/2016.  Had  mild pharyngeal dysphagia secondary to bony protrusion ~C4 which intermittently impedes full epiglottic closure leading to mild vallecular residue which clears with liquid wash. No frank aspiration observed. Regular diet and thin liquid okay)  -I am going to order a DaT scan.  I discussed with him that this really does not help [place] between Parkinson's disease and the atypical states, but he was not all that parkinsonian today.  As above, however, it may just be that the levodopa is covering up some of the symptoms.  He was agreeable and we will go ahead and ordered the DaT scan.  2.  Dysphagia  -Modified barium swallow done in September, 2018 at outside hospital demonstrated penetration even with nectar thick liquids.  I do not have the speech pathologist report, but apparently nectar thick liquids were recommended.  I may need to repeat in a few months.  3.  Alcohol Abuse  -Has been back to the hospital since our last visit because intoxication.  Weaning and discontinuing alcohol.  He has been a binge drinker for many years.   I will see him back after the above has been completed.  On a side note, I did have a discussion with the patient about the importance of respect in our office.  Medical records indicate that he has not always been respectful to his medical providers and he was  mildly agitated when first entering the office (relating to doing MoCA).  He was pleasant with me today and expressed understanding.  Much greater than 50% of this visit was spent in counseling and coordinating care.  Total face to face time:  60 min, which did not include the 35 min of record review prior to the visit which was non face to face time   Cc:  Garrett Door, MD

## 2017-07-18 ENCOUNTER — Telehealth: Payer: Self-pay | Admitting: Neurology

## 2017-07-18 ENCOUNTER — Ambulatory Visit: Payer: Medicare Other | Admitting: Neurology

## 2017-07-18 NOTE — Telephone Encounter (Signed)
Spoke with patient about appt this afternoon. Questioned whether he was coming to appt since he did not have DAT scan performed. He states he did not know about appt, but he will be here at 3 pm.   I did remind him to stay off Levodopa all day for appt. I could not understand his response, but he did say he would call me back.

## 2017-07-18 NOTE — Telephone Encounter (Signed)
I called back and spoke with wife. She states they will not be at appt today and will call back if patient is able to make it into the office in the future.  She states he has gone downhill since he saw us. He has been in and out of the hospital and refusing treatment. States when he is home he won't eat and just drinks straight vodka and becomes belligerent. He has been in the nursing home for a month and she states he can't even walk at the moment due to an infection that is in the bones of his feet.   I left it up to them to make a future follow up with our office. Dr. Arbutus LeasatLorain Childes- FYI.

## 2017-07-18 NOTE — Telephone Encounter (Signed)
New Message  Pts wife verbalized pt has a hard time getting around and she has been in and out of the hospital herself.  Pts wife verbalized on the last OV pt fell when they went out to eat and pt has been in numerous facilities and three hospitals since last OV.  Pts [facility] verbalized [facility].  Pts wife verbalized pt fell several times after the first time.  Pts wife verbalized they went through a horrible time this summer and also stated when he fell again he was hallucinating.  Pts wife verbalized he is trying to leave without medical advice and wife voiced she cannot handle him.  Pts wife verbalized he is really mean and curses her and the nurses out.  Please f/u if needed

## 2017-12-25 DEATH — deceased

## 2018-03-16 IMAGING — MR MR HEAD W/O CM
7 series · 40 of 48 positions shown · non-contrast
Comparison: CT HEAD November 30, 2016 at 1198 hours

CLINICAL DATA: Acute encephalopathy. Frequent falls and slurred
speech. On Coumadin. History of Parkinson's disease, atrial
fibrillation and stroke.

EXAM:
MRI HEAD WITHOUT CONTRAST
TECHNIQUE: Multiplanar, multiecho pulse sequences of the brain and surrounding
structures were obtained without intravenous contrast. Due to
patient motion, sagittal T1, coronal T2 not obtained.

[Series 3: DWI · axial · 3.0mm · 0.94mm/px · z∈[-53,+81]mm · 11 of 94 slices shown (1 of 2)]
[im 1/94]
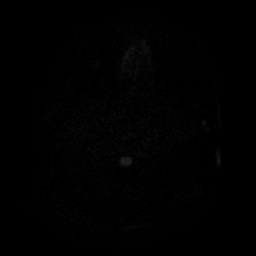
[im 10/94]
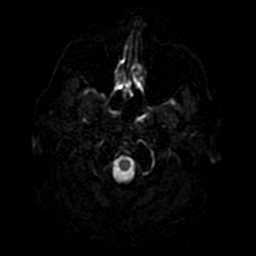
[im 19/94]
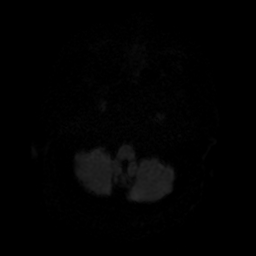
[im 28/94]
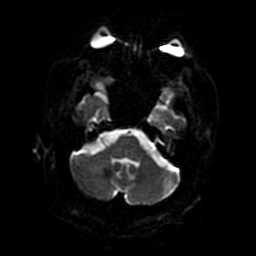
[im 38/94]
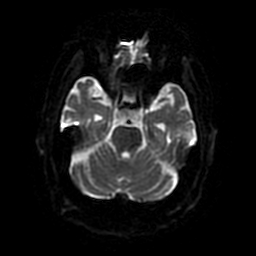
[im 47/94]
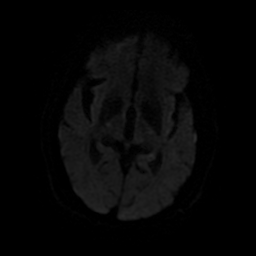
[im 56/94]
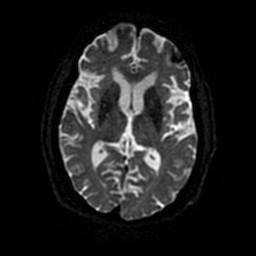
[im 66/94]
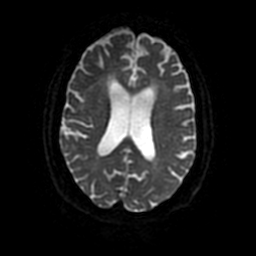
[im 75/94]
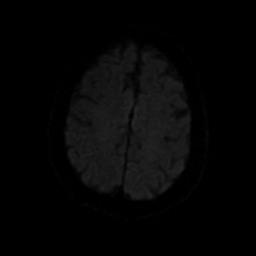
[im 84/94]
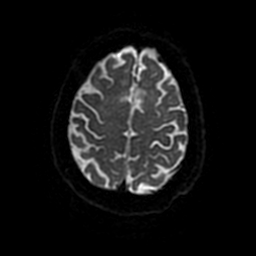
[im 94/94]
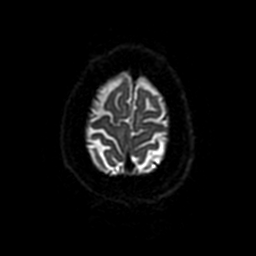

[Series 5: T2 · axial · 5.0mm · 0.47mm/px · z∈[-53,+81]mm · 3 of 24 slices shown]
[im 1/24]
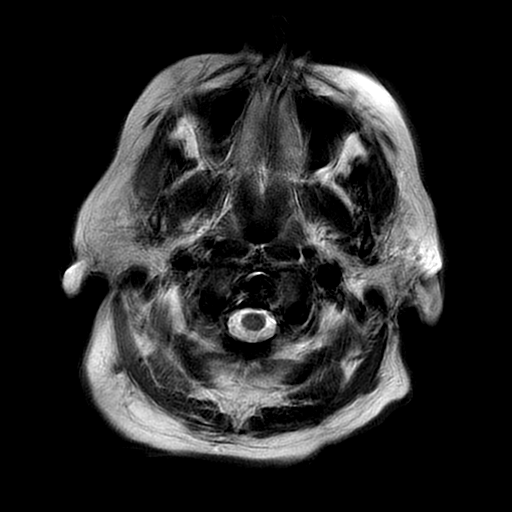
[im 12/24]
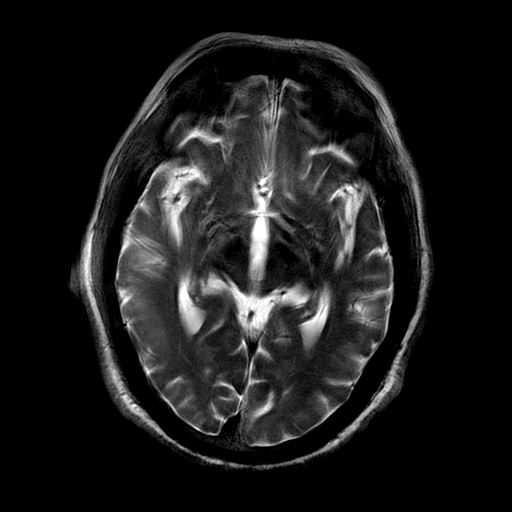
[im 24/24]
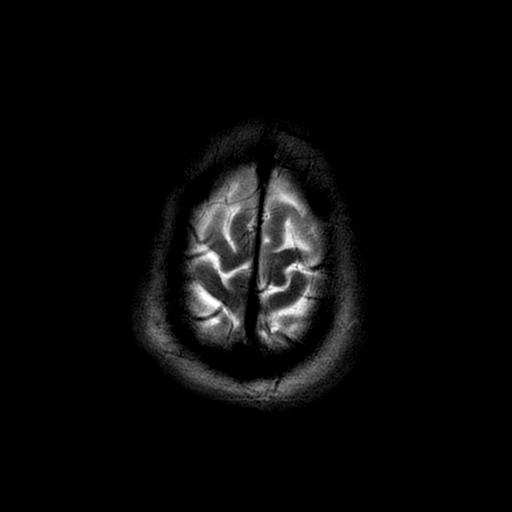

[Series 6: FLAIR · axial · 5.0mm · 0.47mm/px · z∈[-53,+81]mm · 3 of 24 slices shown]
[im 1/24]
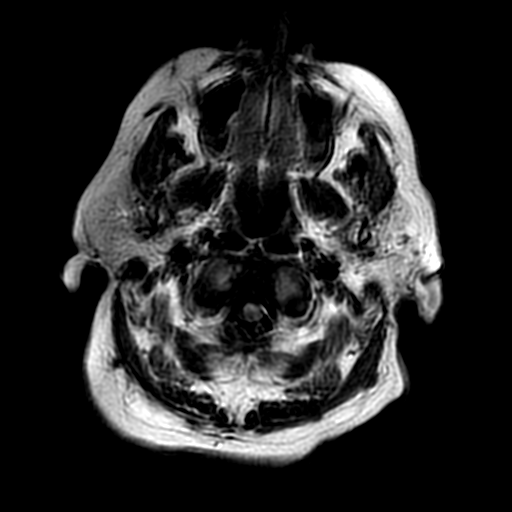
[im 12/24]
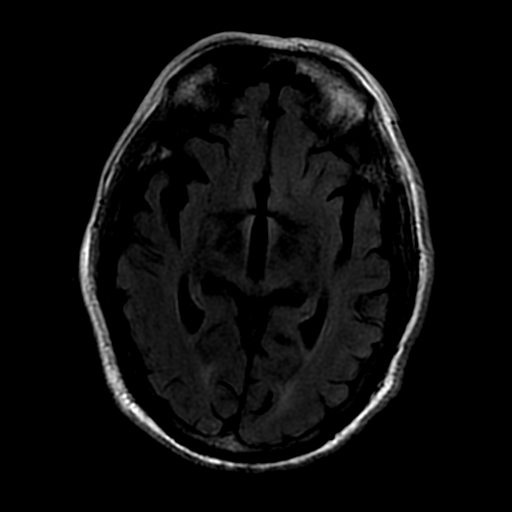
[im 24/24]
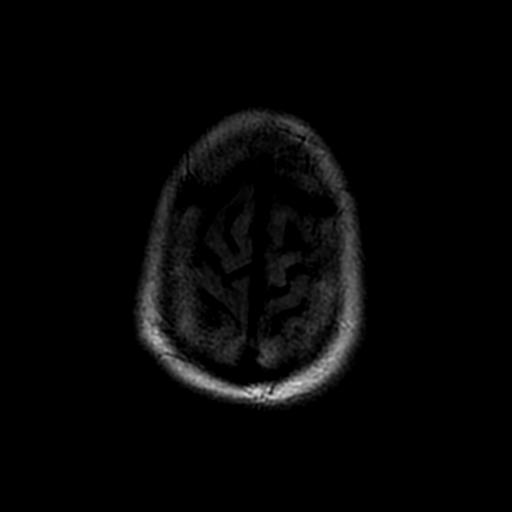

[Series 7: DWI · coronal · 4.0mm · 0.94mm/px · 9 of 72 slices shown (2 of 2)]
[im 1/72]
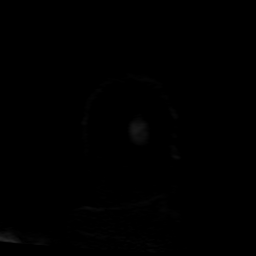
[im 9/72]
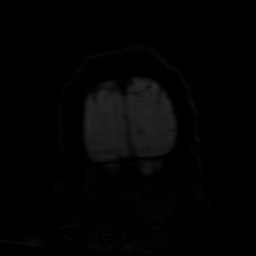
[im 18/72]
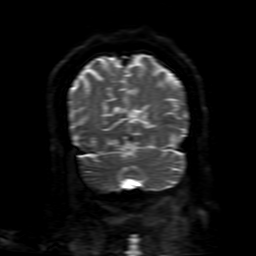
[im 27/72]
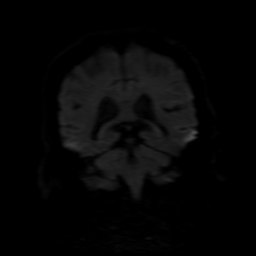
[im 36/72]
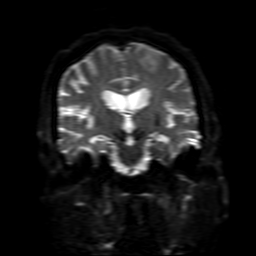
[im 45/72]
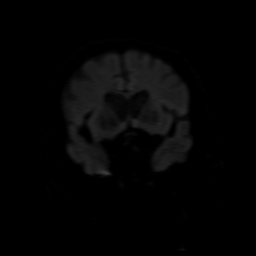
[im 54/72]
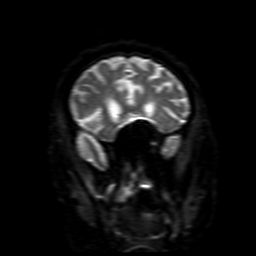
[im 63/72]
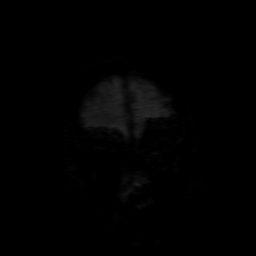
[im 72/72]
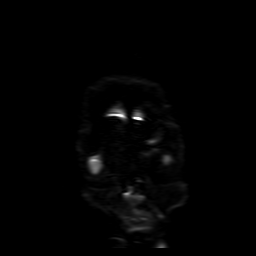

[Series 8: (person_name) · axial · 3.0mm · 0.47mm/px · z∈[-54,+8]mm · 4 of 96 slices shown]
[im 1/96]
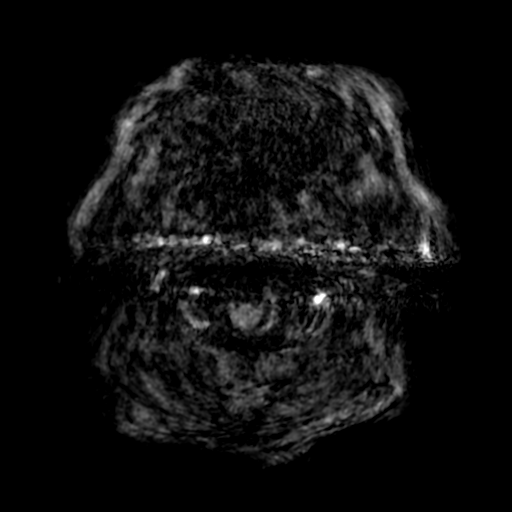
[im 18/96]
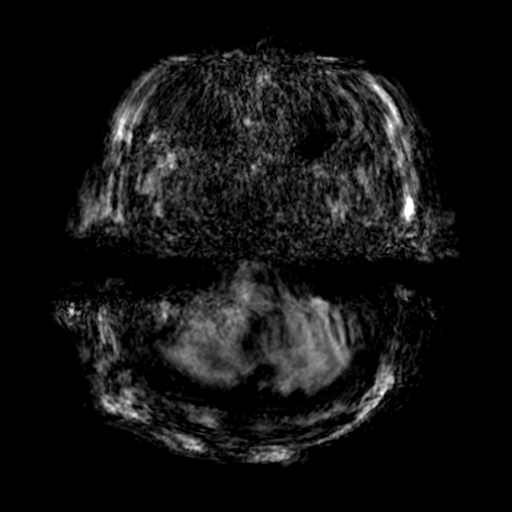
[im 26/96]
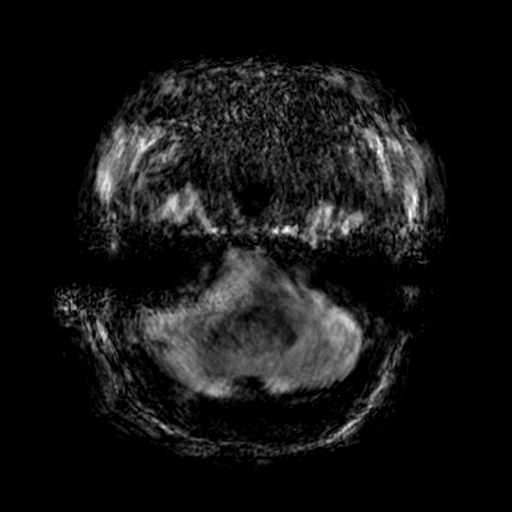
[im 44/96]
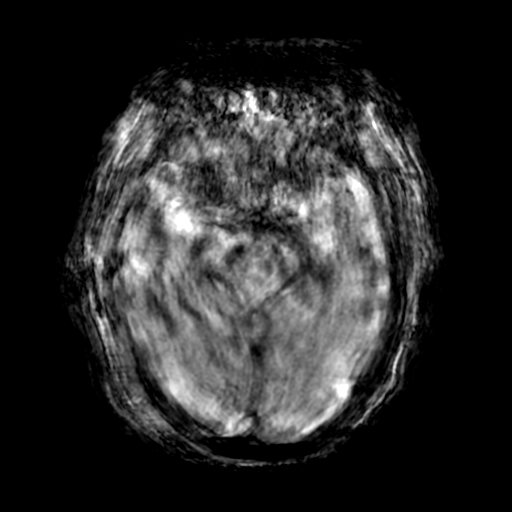

[Series 350: ADC · axial · 3.0mm · 0.94mm/px · z∈[-53,+81]mm · 6 of 47 slices shown (1 of 2)]
[im 1/47]
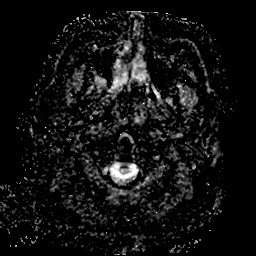
[im 10/47]
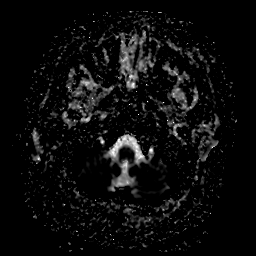
[im 19/47]
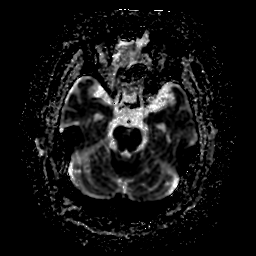
[im 28/47]
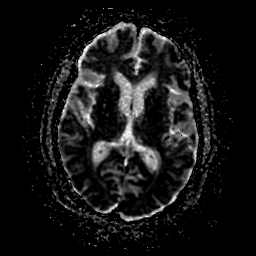
[im 37/47]
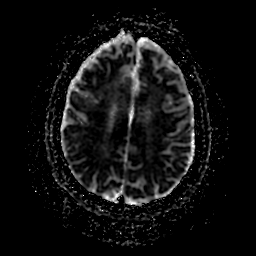
[im 47/47]
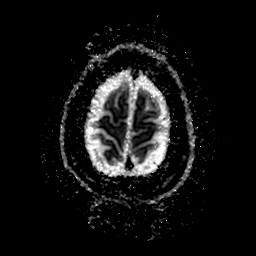

[Series 750: ADC · coronal · 4.0mm · 0.94mm/px · 4 of 36 slices shown (2 of 2)]
[im 1/36]
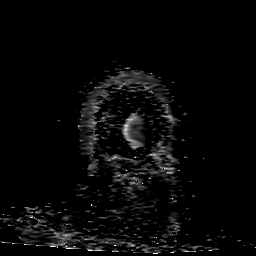
[im 12/36]
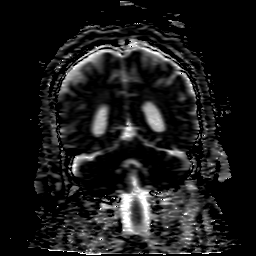
[im 24/36]
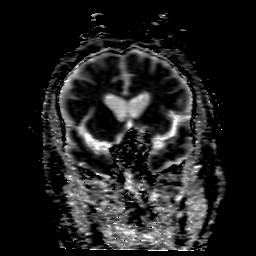
[im 36/36]
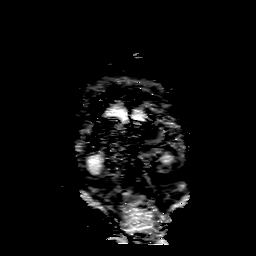

[40 of 48 positions shown; findings below may reference images not displayed]

FINDINGS: Moderately motion degraded examination.

BRAIN: No reduced diffusion to suggest acute ischemia. No
susceptibility artifact to lobar hematoma though with small
hemorrhages would not be appreciated due to motion. Minimal white
matter changes most compatible with chronic small vessel ischemic
disease. The ventricles and sulci are normal for patient's age. No
suspicious parenchymal signal, masses or mass effect. No abnormal
extra-axial fluid collections.

VASCULAR: Normal major intracranial vascular flow voids present at
skull base.

SKULL AND UPPER CERVICAL SPINE: No abnormal sellar expansion. No
suspicious calvarial bone marrow signal. Craniocervical junction
maintained.

SINUSES/ORBITS: Small sphenoid sinus air-fluid levels with mild
paranasal sinus mucosal thickening. Mastoid air cells are well
aerated. The included ocular globes and orbital contents are
non-suspicious.

OTHER: None.
IMPRESSION: Normal noncontrast MRI of the head for age though, limited by
moderate motion and incomplete examination.

## 2018-03-18 IMAGING — RF DG SWALLOWING FUNCTION - NRPT [facility]
1 series · 18 of 24 positions shown · non-contrast
Comparison: none

[Series 1: run · 19 acquisitions, 18 frames shown]
[im 1/19]
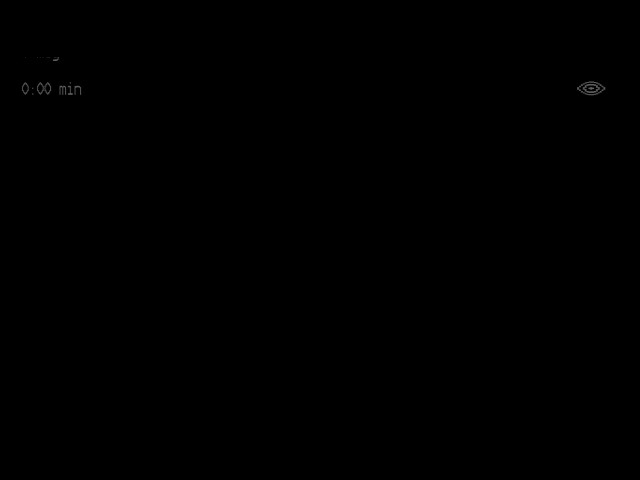
[im 2/19]
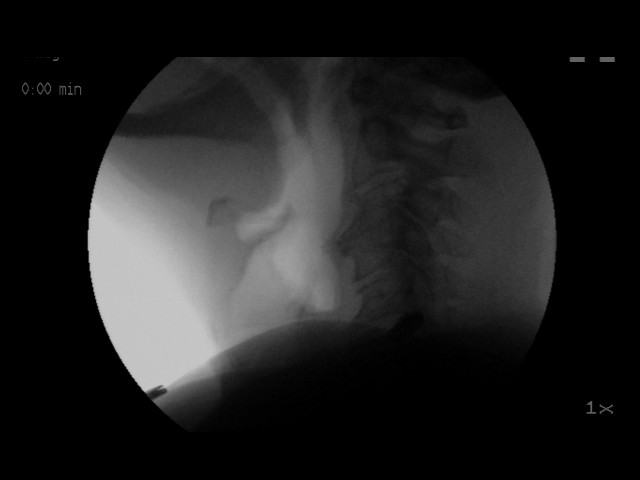
[im 3/19]
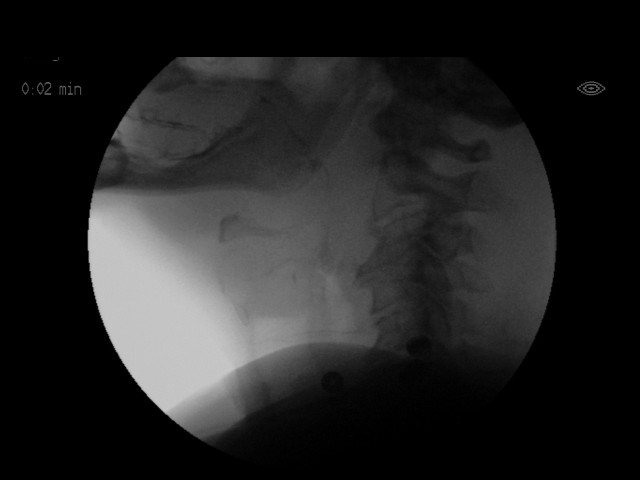
[im 4/19]
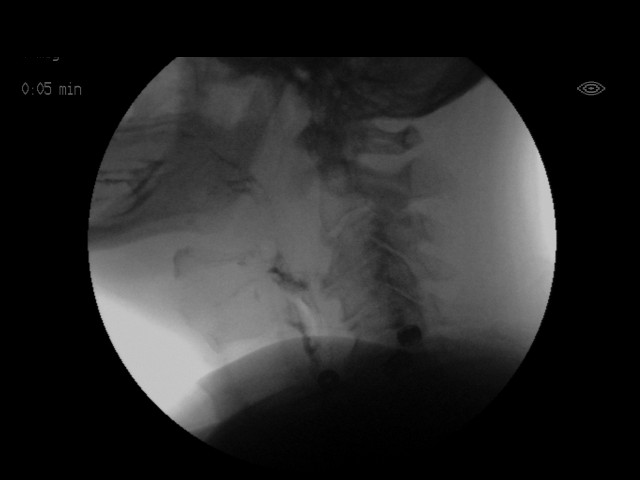
[im 6/19]
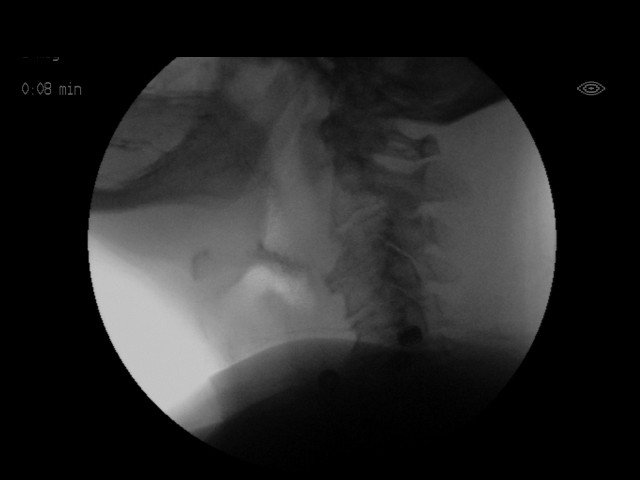
[im 6/19]
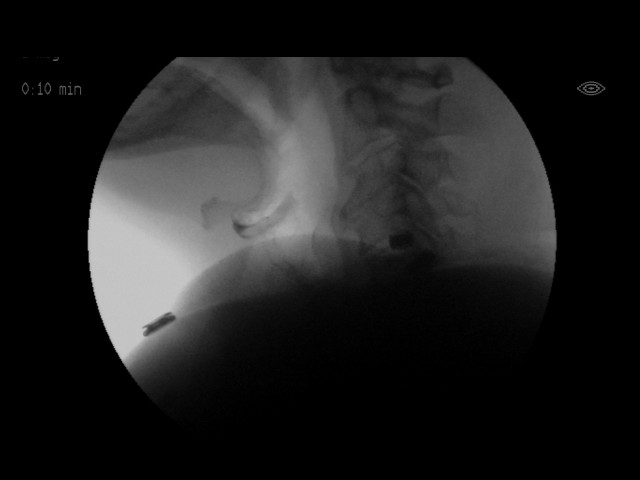
[im 7/19]
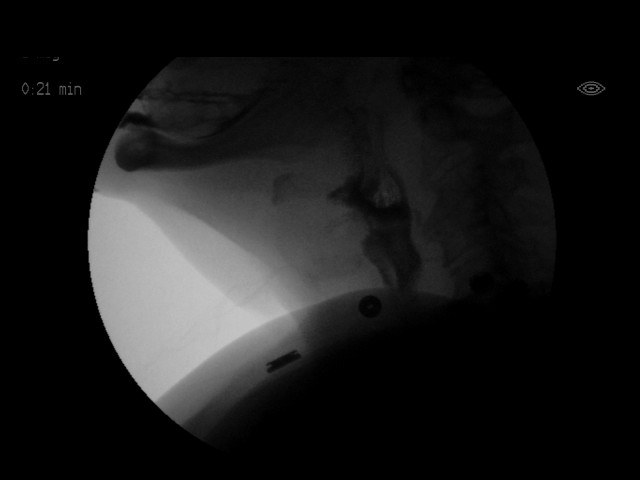
[im 9/19]
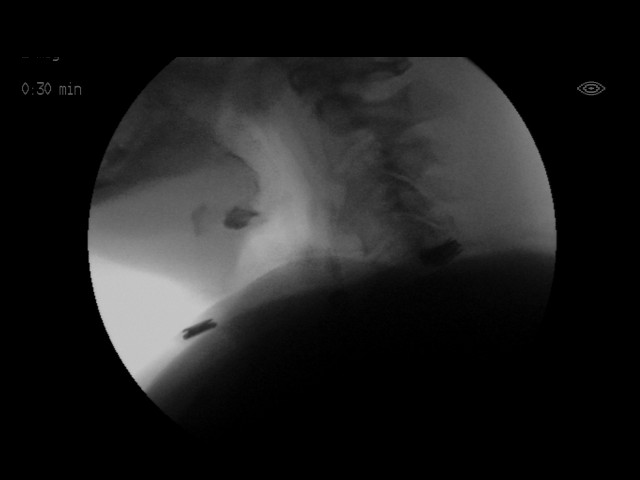
[im 10/19]
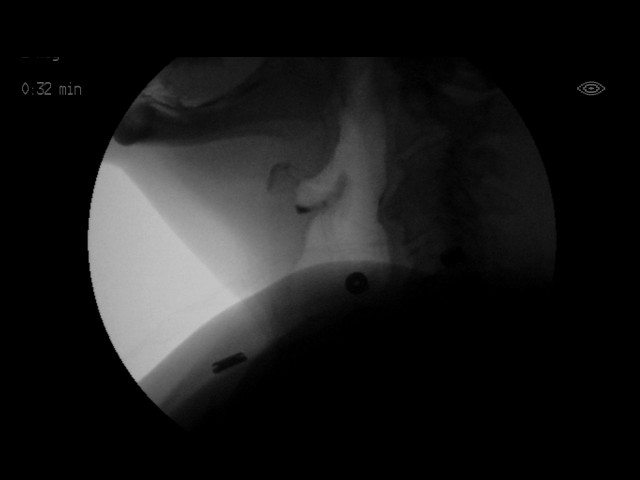
[im 10/19]
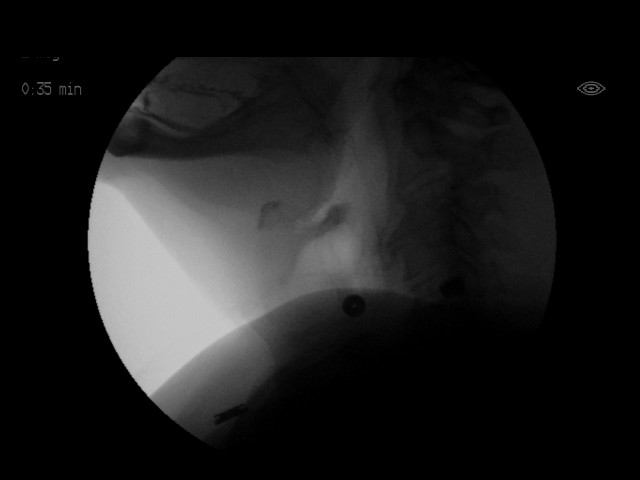
[im 12/19]
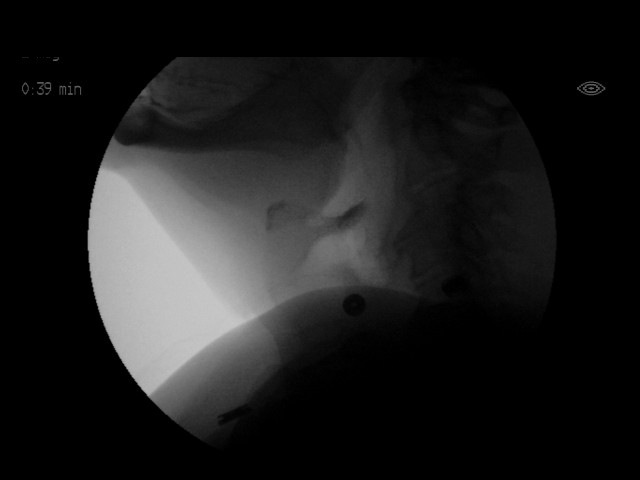
[im 13/19]
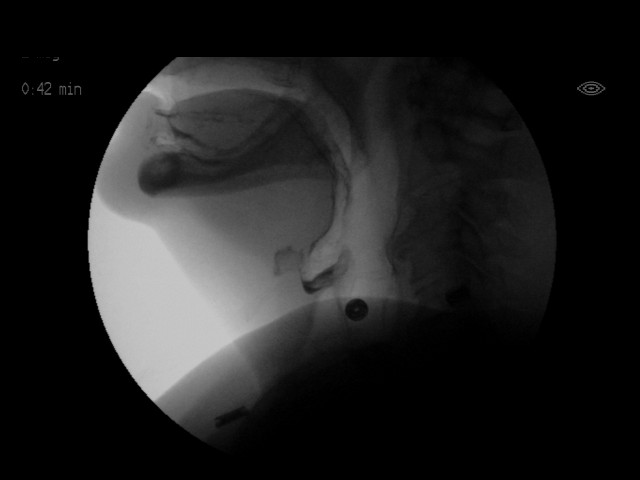
[im 14/19]
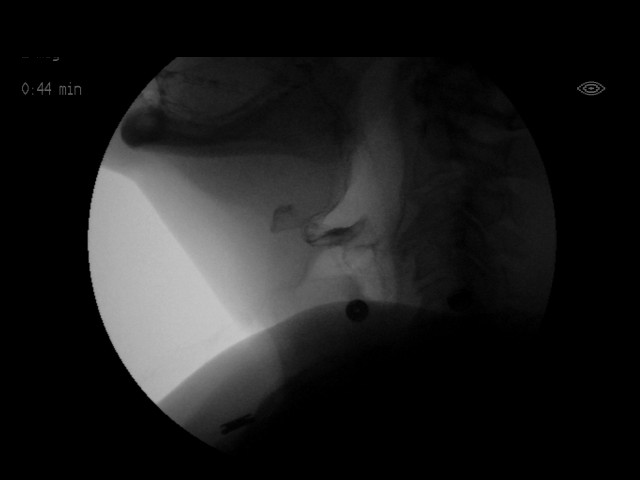
[im 15/19]
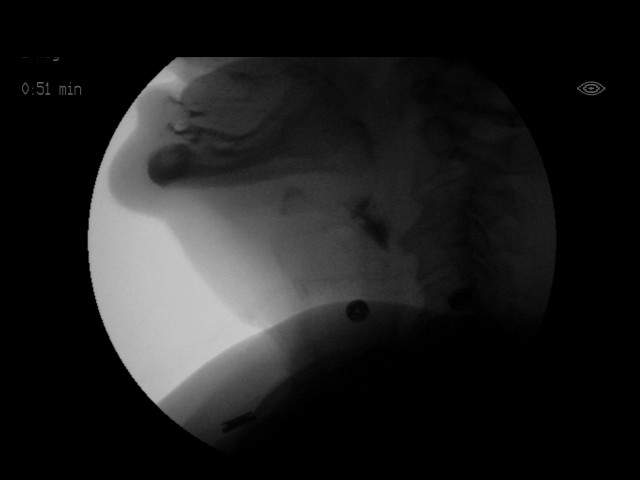
[im 16/19]
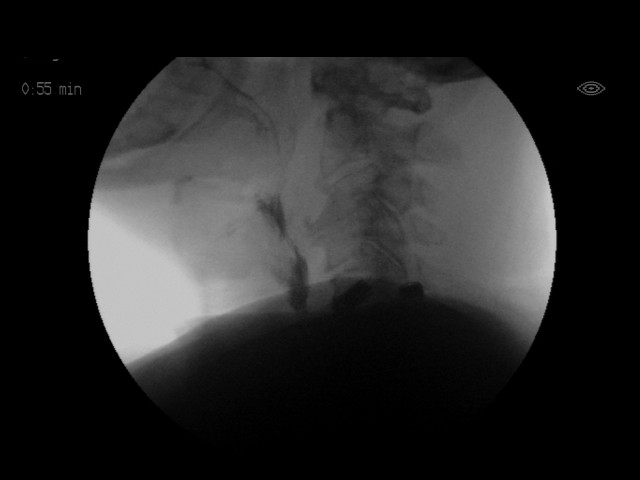
[im 17/19]
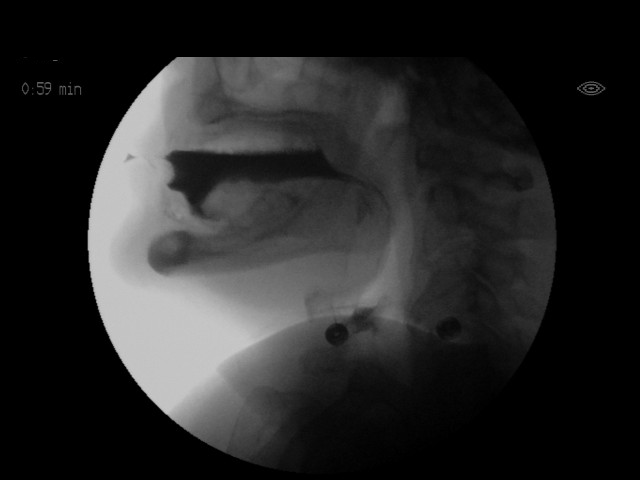
[im 19/19]
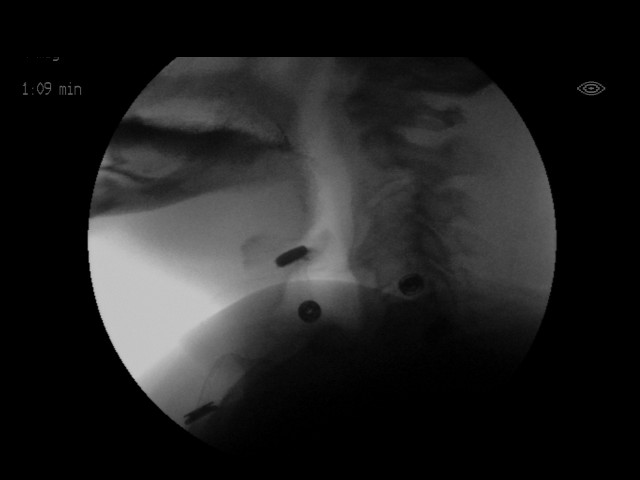
[im 19/19]
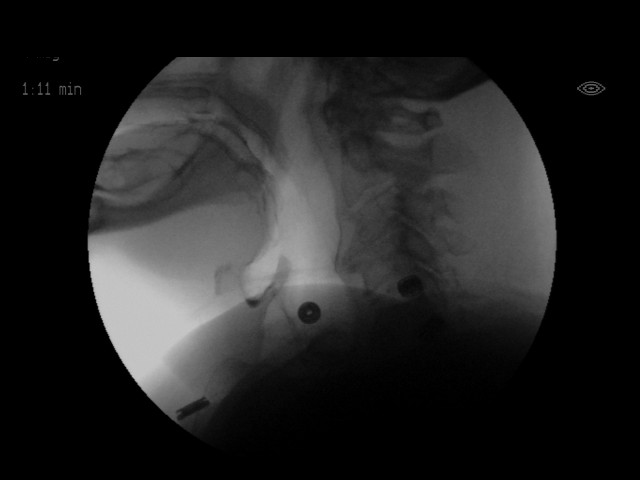

[18 of 24 positions shown; findings below may reference images not displayed]

FLUOROSCOPY FOR SWALLOWING FUNCTION STUDY:
Fluoroscopy was provided for swallowing function study, which was administered by a speech pathologist.  Final results and recommendations from this study are contained within the speech pathology report.
